# Patient Record
Sex: Male | Born: 1975
Health system: Southern US, Community
[De-identification: ages and names within clinical notes are randomized; demographics above are authoritative.]

## PROBLEM LIST (undated history)

## (undated) DIAGNOSIS — N200 Calculus of kidney: Secondary | ICD-10-CM

## (undated) DIAGNOSIS — K2980 Duodenitis without bleeding: Secondary | ICD-10-CM

## (undated) DIAGNOSIS — K449 Diaphragmatic hernia without obstruction or gangrene: Secondary | ICD-10-CM

## (undated) DIAGNOSIS — K209 Esophagitis, unspecified without bleeding: Secondary | ICD-10-CM

## (undated) DIAGNOSIS — E7800 Pure hypercholesterolemia, unspecified: Secondary | ICD-10-CM

## (undated) DIAGNOSIS — K219 Gastro-esophageal reflux disease without esophagitis: Secondary | ICD-10-CM

## (undated) HISTORY — DX: Diaphragmatic hernia without obstruction or gangrene: K44.9

## (undated) HISTORY — DX: Pure hypercholesterolemia, unspecified: E78.00

## (undated) HISTORY — DX: Calculus of kidney: N20.0

## (undated) HISTORY — DX: Esophagitis, unspecified: K20.9

## (undated) HISTORY — DX: Duodenitis without bleeding: K29.80

## (undated) HISTORY — DX: Gastro-esophageal reflux disease without esophagitis: K21.9

## (undated) HISTORY — DX: Esophagitis, unspecified without bleeding: K20.90

---

## 2008-08-29 ENCOUNTER — Emergency Department: Payer: Self-pay | Admitting: Emergency Medicine

## 2008-09-04 ENCOUNTER — Emergency Department (HOSPITAL_COMMUNITY): Admission: EM | Admit: 2008-09-04 | Discharge: 2008-09-05 | Payer: Self-pay | Admitting: Emergency Medicine

## 2008-09-07 ENCOUNTER — Ambulatory Visit: Payer: Self-pay | Admitting: Cardiology

## 2008-10-01 ENCOUNTER — Ambulatory Visit: Payer: Self-pay | Admitting: Unknown Physician Specialty

## 2008-10-30 ENCOUNTER — Emergency Department: Payer: Self-pay | Admitting: Unknown Physician Specialty

## 2008-11-07 ENCOUNTER — Ambulatory Visit: Payer: Self-pay | Admitting: Family Medicine

## 2009-06-15 HISTORY — PX: LITHOTRIPSY: SUR834

## 2009-07-08 ENCOUNTER — Ambulatory Visit: Payer: Self-pay | Admitting: Internal Medicine

## 2009-08-21 IMAGING — CT CT NECK WITH CONTRAST
1 of 2 series · 9 of 14 positions shown, 12 images · non-contrast
Comparison: none

REASON FOR EXAM: dizziness  nausea    pressure   neck pain  eval for Tumor
COMMENTS:

[Series 2: soft tissue · axial · 0.49mm/px · z∈[-12,+214]mm · 9 of 95 slices shown, 12 images]
[im 10/95  soft-tissue]
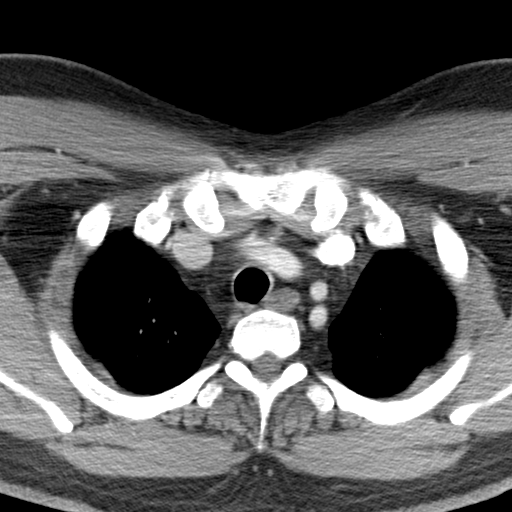
[im 10/95  bone]
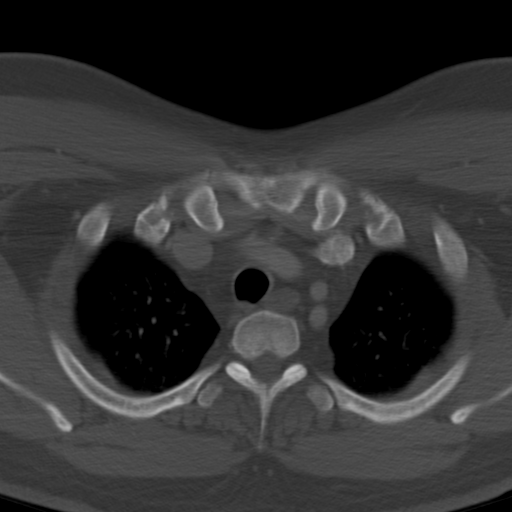
[im 19/95  bone]
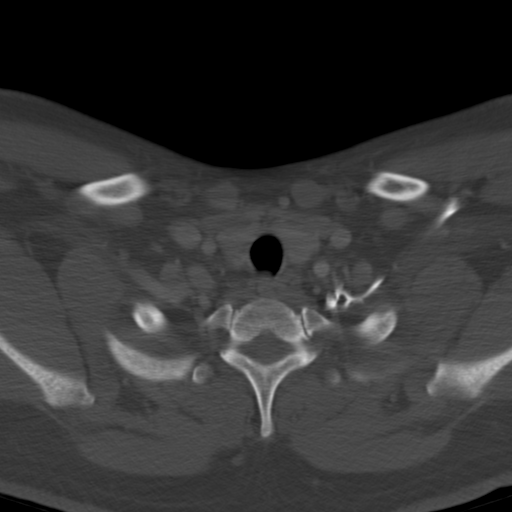
[im 29/95  bone]
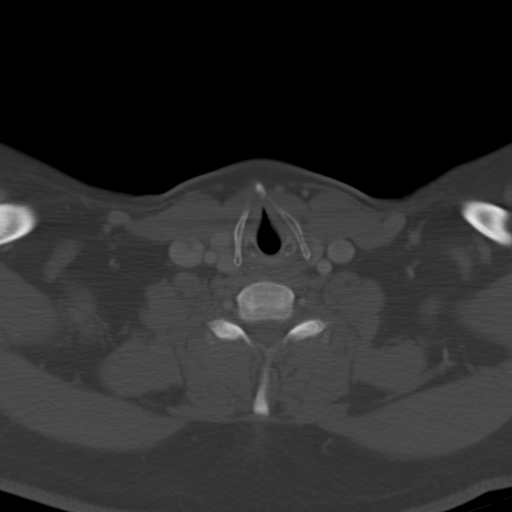
[im 38/95  bone]
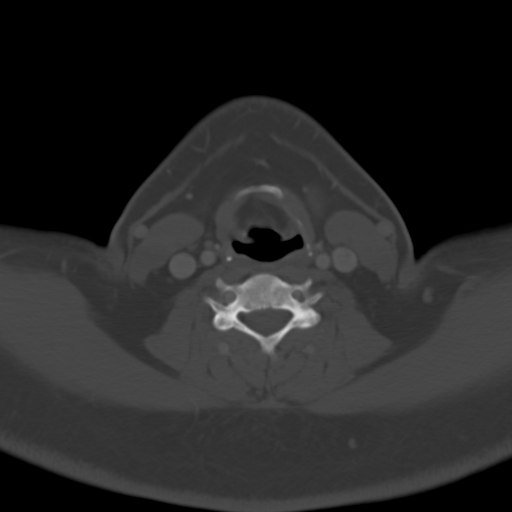
[im 48/95  soft-tissue]
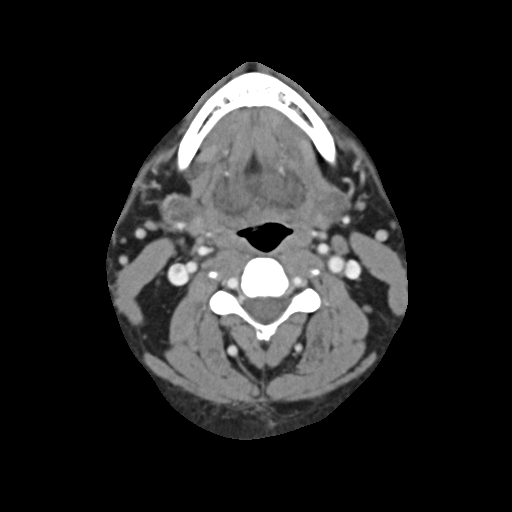
[im 48/95  bone]
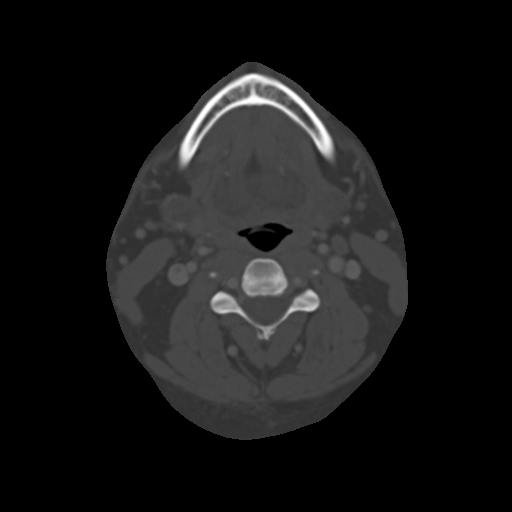
[im 57/95  bone]
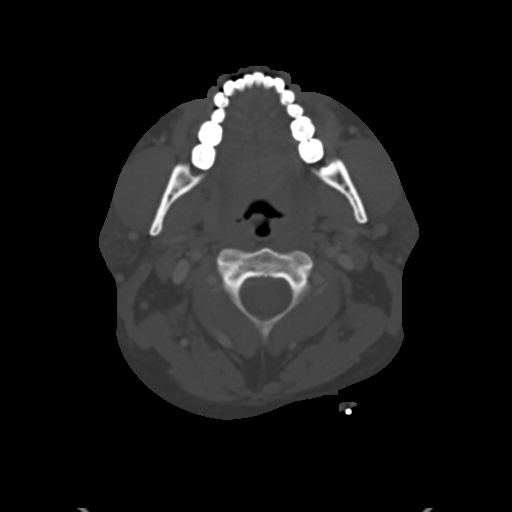
[im 66/95  bone]
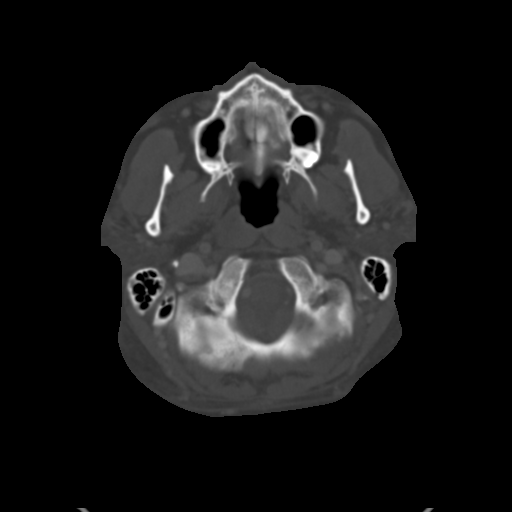
[im 76/95  bone]
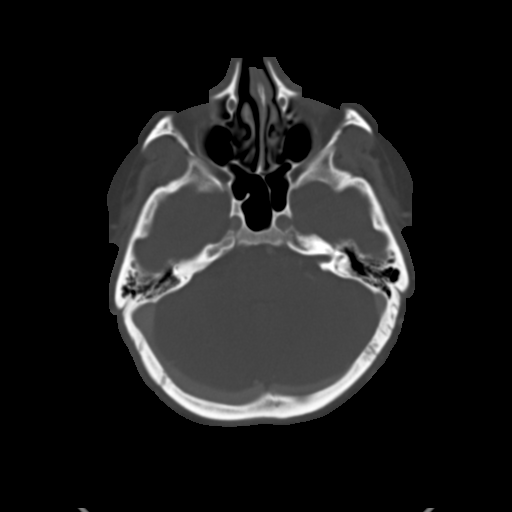
[im 85/95  soft-tissue]
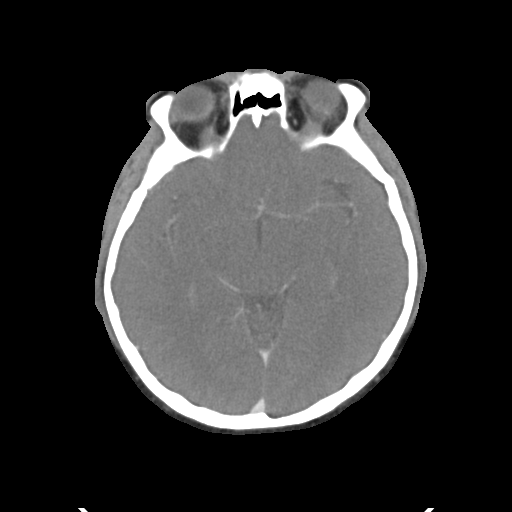
[im 85/95  bone]
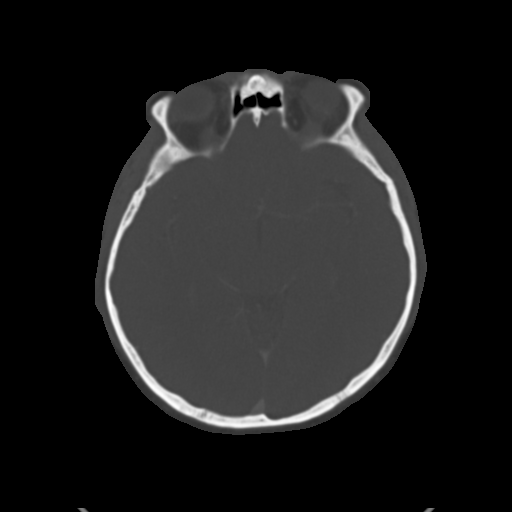

[9 of 14 positions shown; findings below may reference images not displayed]

PROCEDURE:     CT  - CT NECK WITH CONTRAST  - November 07, 2008 [DATE]

RESULT:     CT of the neck is performed utilizing a multislice helical
acquisition with 75 ml of Msovue-BIK iodinated intravenous contrast. Images
are reconstructed in the axial plane at 3 mm slice thickness.

There is no previous exam available for comparison.
FINDINGS: The superior mediastinum, thyroid lobes and salivary glands appear
to enhance normally. No mediastinal mass is evident. There is no evidence of
adenopathy by size criteria. Multiple, nonenlarged lymph nodes are seen in
the cervical region. The nasopharynx, prevertebral soft tissues, oropharynx,
tonsils, base of tongue and laryngeal regions appear to be grossly
unremarkable without a discrete mass. There is some artifact from the
patient's dental work.
IMPRESSION: No discrete mass evident. If there is concern for mass
along the mucosal surfaces, then ENT correlation would be recommended.

## 2010-01-07 ENCOUNTER — Emergency Department (HOSPITAL_COMMUNITY): Admission: EM | Admit: 2010-01-07 | Discharge: 2010-01-07 | Payer: Self-pay | Admitting: Family Medicine

## 2010-02-08 ENCOUNTER — Emergency Department: Payer: Self-pay | Admitting: Emergency Medicine

## 2010-02-13 ENCOUNTER — Ambulatory Visit: Payer: Self-pay | Admitting: Urology

## 2010-08-30 LAB — POCT URINALYSIS DIP (DEVICE)
Glucose, UA: NEGATIVE mg/dL
Ketones, ur: NEGATIVE mg/dL
Specific Gravity, Urine: 1.015 (ref 1.005–1.030)
Urobilinogen, UA: 0.2 mg/dL (ref 0.0–1.0)

## 2010-09-25 LAB — POCT CARDIAC MARKERS
CKMB, poc: <1 ng/mL — ABNORMAL LOW (ref 1.0–8.0)
Myoglobin, poc: 57.6 ng/mL (ref 12–200)
Myoglobin, poc: 59.1 ng/mL (ref 12–200)

## 2010-09-25 LAB — COMPREHENSIVE METABOLIC PANEL
ALT: 77 U/L — ABNORMAL HIGH (ref 0–53)
AST: 40 U/L — ABNORMAL HIGH (ref 0–37)
Calcium: 9.4 mg/dL (ref 8.4–10.5)
Creatinine, Ser: 0.89 mg/dL (ref 0.4–1.5)
GFR calc Af Amer: >60 mL/min (ref >60)
Sodium: 141 mEq/L (ref 135–145)
Total Protein: 6.3 g/dL (ref 6.0–8.3)

## 2010-09-25 LAB — TRICYCLICS SCREEN, URINE: TCA Scrn: NOT DETECTED

## 2010-09-25 LAB — URINALYSIS, ROUTINE W REFLEX MICROSCOPIC
Glucose, UA: NEGATIVE mg/dL
Nitrite: NEGATIVE
Protein, ur: NEGATIVE mg/dL

## 2010-09-25 LAB — CBC
MCHC: 34.7 g/dL (ref 30.0–36.0)
MCV: 88.9 fL (ref 78.0–100.0)
Platelets: 164 10*3/uL (ref 150–400)
RBC: 4.59 MIL/uL (ref 4.22–5.81)
RDW: 12.8 % (ref 11.5–15.5)

## 2010-09-25 LAB — DIFFERENTIAL
Eosinophils Absolute: 0 10*3/uL (ref 0.0–0.7)
Eosinophils Relative: 1 % (ref 0–5)
Lymphocytes Relative: 25 % (ref 12–46)
Lymphs Abs: 1.5 10*3/uL (ref 0.7–4.0)
Monocytes Relative: 8 % (ref 3–12)

## 2010-09-25 LAB — RAPID URINE DRUG SCREEN, HOSP PERFORMED
Amphetamines: NOT DETECTED
Benzodiazepines: NOT DETECTED
Cocaine: NOT DETECTED
Tetrahydrocannabinol: NOT DETECTED

## 2010-09-25 LAB — URINE CULTURE: Culture: NO GROWTH

## 2010-09-25 LAB — PROTIME-INR
INR: 0.9 (ref 0.00–1.49)
Prothrombin Time: 12.7 seconds (ref 11.6–15.2)

## 2010-10-23 ENCOUNTER — Ambulatory Visit: Payer: Self-pay | Admitting: Urology

## 2010-10-28 NOTE — Assessment & Plan Note (Signed)
[facility] OFFICE NOTE   NAME:Isaac Sims, Isaac Sims                     MRN:          [id number]  DATE:09/07/2008                            DOB:          [birth year]    PRIMARY CARE PHYSICIAN:  Dr. Dale [place], [facility].   HISTORY OF PRESENT ILLNESS:  This is a 35 year old with a history of  erosive esophagitis and gastroesophageal reflux disease who presents for  an initial visit here after having 2 recent emergency department visits  with palpitations, tachycardia, and nausea/vomiting/abdominal pain.  The  patient's first emergency department visit was a little bit over a week  ago at [facility].  He came home from work and was  lying on the sofa when he felt like his heart was beating extremely  fast.  He became lightheaded and had 2 shock-like sensations of chest  pain that lasted only for about a second each.  He went to the emergency  department.  In the emergency department, he had a normal EKG.  Rhythm  appeared normal on the monitor.  He said his palpitations gradually  resolved while he was in the emergency department.  His potassium was  noted to be somewhat low and he said he was given some potassium and was  sent home and told to stop drinking so many soft drinks.  The patient  does drink 7-8 caffeinated beverages a day.  He did not cut out caffeine  entirely.  However, he was still having spells of heart racing about 10  times a day after his discharge from the emergency department.  These  were often associated with some nausea and some pain in his left arm.  About a week later, the patient had a severe episode of nausea, pain in  his left arm and began vomiting.  He went to the emergency department at  [facility].  Once, he got to the ER, he actually started  feeling better.  EKG was normal.  Urine drug screen was normal.  He felt  like his heart was still  racing.  However, he watched his heart on the  monitor and he says it looked completely normal and regular.  He was  started back on Nexium.  It was hypothesized that he was having pain  from his erosive esophagitis and gastroesophageal reflux disease and he  was sent home.  He has had no further episodes of elevated heart rate in  the 4-5 days since then.  However, he has had spells of feeling nausea  and vomiting.  Yesterday, it was quite severe, he was nauseated and  vomited most of the morning and part of the afternoon.  This was  associated with some mild right upper quadrant discomfort.  Also, of  note, he did have an abdominal ultrasound done in the emergency  department at [facility] which showed no gallstones, but it did show  some evidence of fatty liver.  As mentioned, since he was discharged  from the [facility] Emergency Department, he has had no further  palpitations.  He has never passed out.  There is no family history of  premature coronary artery disease or any heart disease that he can think  of.  On further questioning, the patient does confirm that he has been  somewhat nauseated on and off over about the last month.  He says he  feels a little better since he started taking Nexium in the last couple  of days.   PAST MEDICAL HISTORY:  1. Erosive esophagitis with gastroesophageal reflux disease.  The      patient's most recent EGD was back in 2009.  2. Hiatal hernia.   SOCIAL HISTORY:  The patient quit smoking in February 2010.  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  He rarely drinks alcohol.  He is married.   FAMILY HISTORY:  There is no family history of premature coronary artery  disease.  There is no family history of congenital heart disease.   REVIEW OF SYSTEMS:  All the systems were reviewed and are negative  except as noted in the history of present illness.   EKG was reviewed and this shows normal sinus rhythm.  This is a normal  EKG.   LABORATORY DATA:  From the  emergency department at [facility],  hematocrit 40.8.  Chemistries were normal.  Cardiac enzymes negative.  Urine drug screen negative.   PHYSICAL EXAMINATION:  VITAL SIGNS:  Blood pressure is 98/50, heart rate  61 and regular.  GENERAL:  This is an obese male in no apparent distress.  NEUROLOGIC:  Alert and oriented x3.  Normal affect.  LUNGS:  Clear to auscultation bilaterally with normal respiratory  effort.  NECK:  There is no JVD.  There is no thyromegaly or thyroid nodule.  CARDIOVASCULAR:  Heart regular S1, S2.  No S3.  No S4.  There is no  murmur.  There is no peripheral edema.  There are 2+ posterior tibial  pulses bilaterally.  There is no carotid bruit.  ABDOMEN:  Soft and obese.  There is feeling of fullness when I palpate  in the right upper quadrant and the epigastrium.  EXTREMITIES:  No clubbing or cyanosis.  SKIN:  Normal.  MUSCULOSKELETAL:  Normal.  HEENT:  Normal.   ASSESSMENT AND PLAN:  This is a 35 year old with multiple recent  episodes of heart racing associated with nausea, vomiting, and abdominal  fullness.  My suspicion is that the root cause of the patient's symptoms  is probably gastrointestinal.  He does have a history of  gastroesophageal reflux disease and erosive esophagitis.  He says that  he was nauseated and vomited most of the day yesterday.  He did have an  abdominal ultrasound showing no gallstones.  He has started on Nexium  and says he thinks he is feeling a little bit better.  I do think that  further evaluation by gastroenterologist would be in order.  He was  seeing [facility] in [place] in the past.  However, he  wants to seen here in [place], so I will refer him over to [facility].  It is reassuring that when he felt like his  heart was racing, he could see on the monitor in the emergency  department that his heart rate looked completely normal.  All EKGs have  been normal and his telemetry  monitoring in the emergency department at  [facility] and at [facility] had been normal.  Therefore, I think we will  defer further cardiac evaluation for now.  We will send  him to see  Gastroenterology and if he has any further episodes of heart racing, we  will set him up to do a monitor.  I do not think right  now that the Holter monitor would catch anything as he has not had any  feeling of heart racing into the last 4-5 days.     Marca Ancona, MD  Electronically Signed    DM/MedQ  DD: 09/07/2008  DT: 09/08/2008  Job #: [id number]   cc:   [place]

## 2010-12-08 ENCOUNTER — Ambulatory Visit: Payer: Self-pay | Admitting: Urology

## 2011-10-09 ENCOUNTER — Ambulatory Visit: Payer: Self-pay | Admitting: Internal Medicine

## 2012-05-01 ENCOUNTER — Encounter: Payer: Self-pay | Admitting: Internal Medicine

## 2012-05-02 ENCOUNTER — Ambulatory Visit (INDEPENDENT_AMBULATORY_CARE_PROVIDER_SITE_OTHER): Payer: 59 | Admitting: Internal Medicine

## 2012-05-02 ENCOUNTER — Encounter: Payer: Self-pay | Admitting: Internal Medicine

## 2012-05-02 ENCOUNTER — Ambulatory Visit (INDEPENDENT_AMBULATORY_CARE_PROVIDER_SITE_OTHER)
Admission: RE | Admit: 2012-05-02 | Discharge: 2012-05-02 | Disposition: A | Discharge status: Home / Self Care | Payer: 59 | Source: Ambulatory Visit | Attending: Internal Medicine | Admitting: Internal Medicine

## 2012-05-02 VITALS — BP 120/70 | HR 81 | Temp 98.7°F | Ht 71.0 in | Wt 267.5 lb

## 2012-05-02 DIAGNOSIS — N200 Calculus of kidney: Secondary | ICD-10-CM

## 2012-05-02 DIAGNOSIS — M25519 Pain in unspecified shoulder: Secondary | ICD-10-CM

## 2012-05-02 DIAGNOSIS — G4730 Sleep apnea, unspecified: Secondary | ICD-10-CM

## 2012-05-02 DIAGNOSIS — R7989 Other specified abnormal findings of blood chemistry: Secondary | ICD-10-CM

## 2012-05-02 DIAGNOSIS — R945 Abnormal results of liver function studies: Secondary | ICD-10-CM

## 2012-05-02 DIAGNOSIS — E7800 Pure hypercholesterolemia, unspecified: Secondary | ICD-10-CM

## 2012-05-02 DIAGNOSIS — K219 Gastro-esophageal reflux disease without esophagitis: Secondary | ICD-10-CM

## 2012-05-02 DIAGNOSIS — Z139 Encounter for screening, unspecified: Secondary | ICD-10-CM

## 2012-05-02 LAB — LIPID PANEL
Total CHOL/HDL Ratio: 7
Triglycerides: 168 mg/dL — ABNORMAL HIGH (ref 0.0–149.0)

## 2012-05-02 LAB — HEPATIC FUNCTION PANEL
ALT: 37 U/L (ref 0–53)
AST: 27 U/L (ref 0–37)
Bilirubin, Direct: 0.1 mg/dL (ref 0.0–0.3)
Total Bilirubin: 0.5 mg/dL (ref 0.3–1.2)
Total Protein: 7.8 g/dL (ref 6.0–8.3)

## 2012-05-02 LAB — LDL CHOLESTEROL, DIRECT: Direct LDL: 183.9 mg/dL

## 2012-05-02 NOTE — Patient Instructions (Addendum)
It was nice seeing you today.  I am glad you have been doing well.  I am going to get you scheduled for a sleep study.  Let me know if you need anything.

## 2012-05-03 ENCOUNTER — Telehealth: Payer: Self-pay | Admitting: *Deleted

## 2012-05-03 ENCOUNTER — Other Ambulatory Visit: Payer: Self-pay | Admitting: Internal Medicine

## 2012-05-03 DIAGNOSIS — E7800 Pure hypercholesterolemia, unspecified: Secondary | ICD-10-CM

## 2012-05-03 DIAGNOSIS — R945 Abnormal results of liver function studies: Secondary | ICD-10-CM

## 2012-05-03 NOTE — Telephone Encounter (Signed)
Patient returned your phone call. He said you could reach him at

## 2012-05-08 ENCOUNTER — Ambulatory Visit: Payer: Self-pay | Admitting: Internal Medicine

## 2012-05-15 ENCOUNTER — Encounter: Payer: Self-pay | Admitting: Internal Medicine

## 2012-05-15 DIAGNOSIS — K219 Gastro-esophageal reflux disease without esophagitis: Secondary | ICD-10-CM | POA: Insufficient documentation

## 2012-05-15 DIAGNOSIS — N200 Calculus of kidney: Secondary | ICD-10-CM | POA: Insufficient documentation

## 2012-05-15 NOTE — Progress Notes (Signed)
  Subjective:    Patient ID: Isaac Sims, male    DOB: [birth year], 36 y.o.   MRN: [id number]  HPI 36 year old male with past history of esophagitis/duodenitis and hiatal hernia who comes in today for a scheduled follow up.  He states he is doing relatively well.  Bowels doing better.  No significant acid reflux.  Taking Prilosec regularly.  Had adjusted his diet and lost weight, but got off his routine.  Plans to get more serious with his diet and exercise.  No nausea or vomiting.  Bowels stable.  He is having some shoulder discomfort.  Has a remote history of a shoulder injury.  Does not limit him with doing his activities - just notices more at times.  Persistent/intermittent flares.    Past Medical History  Diagnosis Date  . Esophagitis   . Duodenitis   . Hiatal hernia   . Hypercholesterolemia   . Nephrolithiasis   . GERD (gastroesophageal reflux disease)     Outpatient Encounter Prescriptions as of 05/02/2012  Medication Sig Dispense Refill  . calcium carbonate (TUMS - DOSED IN MG ELEMENTAL CALCIUM) 500 MG chewable tablet Chew 1 tablet by mouth daily.      . cetirizine (ZYRTEC) 10 MG tablet Take 10 mg by mouth daily as needed.      Marland Kitchen omeprazole (PRILOSEC) 20 MG capsule Take 20 mg by mouth daily.        Review of Systems Patient denies any headache, lightheadedness or dizziness.  No significant sinus or allergy problems. No chest pain, tightness or palpitations.  No increased shortness of breath, cough or congestion.  No nausea or vomiting.  No abdominal pain or cramping.  Acid reflux controlled on Prilosec.  No significant bowel change, such as diarrhea, constipation, BRBPR or melana.  Bowels are doing better.  No urine change.  Does report increased fatigue and snoring.  Increased fatigue and daytime somnolence.  Had previously wanted to postpone a sleep study - agreeable now.      Objective:   Physical Exam Filed Vitals:   05/02/12 0944  BP: 120/70  Pulse: 81  Temp: 98.7 F  (50.73 C)   36 year old male in no acute distress.   HEENT:  Nares - clear.  OP- without lesions or erythema.  NECK:  Supple, nontender.  No audible bruit.   HEART:  Appears to be regular. LUNGS:  Without crackles or wheezing audible.  Respirations even and unlabored.   RADIAL PULSE:  Equal bilaterally.  ABDOMEN:  Soft, nontender.  No audible abdominal bruit.   EXTREMITIES:  No increased edema to be present.    MSK.  Some minimal discomfort in the right shoulder with full extension.                    Assessment & Plan:  SHOULDER PAIN.  Discussed PT.  He declines.  Will check xray.  Further w/up pending results.   WEIGHT LOSS.  Plans to get more serious about his diet and exercise.  Follow.   HIVES.  Takes Zyrtec regularly.  Doing well now.  Follow.   POSSIBLE SLEEP APNEA.  Wakes up tired, has daytime somnolence and increased snoring.  We again discussed sleep study.  He was agreeable.  Will schedule a split night sleep study.    SMOKING CESSATION.  Needs to quit smoking.    CARDIOVASCULAR.  Currently asymptomatic.  Follow.    HEALTH MAINTENANCE.  Physical 09/29/11.

## 2012-05-15 NOTE — Assessment & Plan Note (Signed)
Worked up by Dr Wolff.  S/P lithotripsy.  Currently asymptomatic.  Continue to follow up with Dr Wolff.  

## 2012-05-15 NOTE — Assessment & Plan Note (Signed)
He also has known esophagitis and duodenitis.  On prilosec now.  Symptoms controlled.  Bowels better.     

## 2012-06-02 ENCOUNTER — Telehealth: Payer: Self-pay | Admitting: *Deleted

## 2012-06-03 NOTE — Telephone Encounter (Signed)
error 

## 2012-07-30 ENCOUNTER — Other Ambulatory Visit: Payer: Self-pay

## 2012-08-03 ENCOUNTER — Telehealth: Payer: Self-pay | Admitting: *Deleted

## 2012-08-03 ENCOUNTER — Encounter: Payer: Self-pay | Admitting: Internal Medicine

## 2012-08-03 ENCOUNTER — Encounter: Payer: Self-pay | Admitting: *Deleted

## 2012-08-03 NOTE — Progress Notes (Signed)
Letter sent to patient regarding his sleep apnea and contacting his insurance company for approval.

## 2012-08-03 NOTE — Telephone Encounter (Signed)
Patient left message stating they are waiting on a date for him to do the second part of his sleep study. Please call patient to clarify.

## 2012-08-03 NOTE — Progress Notes (Signed)
  Subjective:    Patient ID: Isaac Sims, male    DOB: 08-10-1975, 37 y.o.   MRN: [id number]  HPI    Review of Systems     Objective:   Physical Exam        Assessment & Plan:

## 2012-08-04 NOTE — Telephone Encounter (Signed)
Isaac Sims had his form and was asking me about it yesterday.  Please get form and contact [facility] regarding appt time.

## 2012-08-09 NOTE — Telephone Encounter (Signed)
Pt returned jamie's call.  Spouse stated they are ready to set up appointment

## 2012-08-10 NOTE — Telephone Encounter (Signed)
Documents given to [facility] and she will call patient to have him reschedule his appointment with [facility]. No referral is necessary at this time per Amber.

## 2012-09-22 ENCOUNTER — Encounter: Payer: Self-pay | Admitting: Internal Medicine

## 2012-09-22 ENCOUNTER — Ambulatory Visit: Payer: 59 | Admitting: Adult Health

## 2012-09-22 ENCOUNTER — Ambulatory Visit (INDEPENDENT_AMBULATORY_CARE_PROVIDER_SITE_OTHER): Payer: BC Managed Care – PPO | Admitting: Internal Medicine

## 2012-09-22 ENCOUNTER — Other Ambulatory Visit: Payer: Self-pay | Admitting: Rheumatology

## 2012-09-22 VITALS — BP 120/72 | HR 84 | Temp 97.6°F | Resp 18 | Wt 281.8 lb

## 2012-09-22 DIAGNOSIS — M25429 Effusion, unspecified elbow: Secondary | ICD-10-CM

## 2012-09-22 DIAGNOSIS — M25421 Effusion, right elbow: Secondary | ICD-10-CM

## 2012-09-22 LAB — SYNOVIAL CELL COUNT + DIFF, W/ CRYSTALS
Basophil: 0 %
Crystals, Joint Fluid: NONE SEEN
Eosinophil: 0 %
Nucleated Cell Count: 1291 /mm3
Other Cells BF: 0 %
Other Mononuclear Cells: 29 %

## 2012-09-25 ENCOUNTER — Encounter: Payer: Self-pay | Admitting: Internal Medicine

## 2012-09-25 NOTE — Progress Notes (Signed)
Subjective:    Patient ID: Isaac Sims, male    DOB: 08/26/1975, 37 y.o.   MRN: [id number]  HPI 37 year old male with past history of esophagitis/duodenitis and hiatal hernia who comes in today as a work in with concerns regarding increased swelling around his elbow.  He states he has been doing relatively well.  Noticed a couple of weeks ago swelling around his right elbow.  He had been working under his house.  Crawling and applying increased pressure on his elbows for several days.  Then noticed some increased soft tissue swelling, redness and increased warmth - right elbow.  The swelling has improved.  No redness or warmth now.  No pain.  Able to bend his arm without difficulty.  He was concerned because of the persistent swelling.    I also discussed with him regarding his recent sleep study.  Having a hard time getting the CPAP titration.  Insurance not covering.  Have given him some options to look into.  He does plan to follow through with this.    Past Medical History  Diagnosis Date  . Esophagitis   . Duodenitis   . Hiatal hernia   . Hypercholesterolemia   . Nephrolithiasis   . GERD (gastroesophageal reflux disease)     Outpatient Encounter Prescriptions as of 09/22/2012  Medication Sig Dispense Refill  . calcium carbonate (TUMS - DOSED IN MG ELEMENTAL CALCIUM) 500 MG chewable tablet Chew 1 tablet by mouth daily.      . cetirizine (ZYRTEC) 10 MG tablet Take 10 mg by mouth daily as needed.      Marland Kitchen omeprazole (PRILOSEC) 20 MG capsule Take 20 mg by mouth daily.       No facility-administered encounter medications on file as of 09/22/2012.    Review of Systems Patient denies any headache, lightheadedness or dizziness.  No significant sinus or allergy problems. No chest pain, tightness or palpitations.  No increased shortness of breath, cough or congestion.  No nausea or vomiting.  No abdominal pain or cramping.  Main complaint is that of persistent right elbow swelling.  No pain  now.  No redness or increased warmth now.       Objective:   Physical Exam  Filed Vitals:   09/22/12 1401  BP: 120/72  Pulse: 84  Temp: 97.6 F (36.4 C)  Resp: 45   37 year old male in no acute distress.  NECK:  Supple, nontender.  No audible bruit.   HEART:  Appears to be regular. LUNGS:  Without crackles or wheezing audible.  Respirations even and unlabored.   RADIAL PULSE:  Equal bilaterally.  MSK:  Increased soft tissue swelling - right elbow.  No pain with flexion and extension.  No increased erythema or warmth.                    Assessment & Plan:  MSK.  Elbow swelling and exam as outlined.  Has improved.  Discussed conservative measures - especially given improvement.  He states the swelling has not improved further in the past week and request referral for further evaluation and possible aspiration.  Will refer to Dr Gavin Potters for further evaluation and treatment.   SLEEP APNEA.  Recent sleep study revealed sleep apnea.  Needs CPAP titration.  Insurance not covering.  Have given him other options to check out.  He plans to follow through with getting this arranged.    CARDIOVASCULAR.  Currently asymptomatic.  Follow.  HEALTH MAINTENANCE.  Physical 09/29/11.

## 2012-10-03 ENCOUNTER — Other Ambulatory Visit: Payer: 59

## 2012-10-05 ENCOUNTER — Encounter: Payer: 59 | Admitting: Internal Medicine

## 2012-10-10 ENCOUNTER — Other Ambulatory Visit (INDEPENDENT_AMBULATORY_CARE_PROVIDER_SITE_OTHER): Payer: BC Managed Care – PPO

## 2012-10-10 DIAGNOSIS — R945 Abnormal results of liver function studies: Secondary | ICD-10-CM

## 2012-10-10 DIAGNOSIS — R7989 Other specified abnormal findings of blood chemistry: Secondary | ICD-10-CM

## 2012-10-10 DIAGNOSIS — E7800 Pure hypercholesterolemia, unspecified: Secondary | ICD-10-CM

## 2012-10-10 LAB — LIPID PANEL
HDL: 29.6 mg/dL — ABNORMAL LOW (ref >39.00)
Total CHOL/HDL Ratio: 8
VLDL: 60.6 mg/dL — ABNORMAL HIGH (ref 0.0–40.0)

## 2012-10-10 LAB — HEPATIC FUNCTION PANEL
AST: 31 U/L (ref 0–37)
Alkaline Phosphatase: 46 U/L (ref 39–117)
Bilirubin, Direct: 0 mg/dL (ref 0.0–0.3)

## 2012-10-10 LAB — LDL CHOLESTEROL, DIRECT: Direct LDL: 192.4 mg/dL

## 2012-10-13 ENCOUNTER — Ambulatory Visit (INDEPENDENT_AMBULATORY_CARE_PROVIDER_SITE_OTHER): Payer: BC Managed Care – PPO | Admitting: Internal Medicine

## 2012-10-13 ENCOUNTER — Encounter: Payer: Self-pay | Admitting: Internal Medicine

## 2012-10-13 VITALS — BP 122/80 | HR 77 | Temp 97.8°F | Ht 71.5 in | Wt 285.8 lb

## 2012-10-13 DIAGNOSIS — G4733 Obstructive sleep apnea (adult) (pediatric): Secondary | ICD-10-CM

## 2012-10-13 DIAGNOSIS — K219 Gastro-esophageal reflux disease without esophagitis: Secondary | ICD-10-CM

## 2012-10-13 DIAGNOSIS — N200 Calculus of kidney: Secondary | ICD-10-CM

## 2012-10-13 DIAGNOSIS — E7800 Pure hypercholesterolemia, unspecified: Secondary | ICD-10-CM

## 2012-10-13 NOTE — Progress Notes (Signed)
  Subjective:    Patient ID: Isaac Sims, male    DOB: 1975-08-21, 37 y.o.   MRN: [id number]  HPI 37 year old male with past history of esophagitis/duodenitis and hiatal hernia who comes in today to follow up on these issues as well as for a complete physical exam.  He states he has been doing relatively well.  Had elbow swelling previously.  Saw Dr Gavin Potters and had fluid aspirated.  Doing well now.  No pain.  Has been using CPAP autotitration for the last two weeks.  Sleeping better.  Energy better.  Still getting used to the mask.     Past Medical History  Diagnosis Date  . Esophagitis   . Duodenitis   . Hiatal hernia   . Hypercholesterolemia   . Nephrolithiasis   . GERD (gastroesophageal reflux disease)     Outpatient Encounter Prescriptions as of 10/13/2012  Medication Sig Dispense Refill  . calcium carbonate (TUMS - DOSED IN MG ELEMENTAL CALCIUM) 500 MG chewable tablet Chew 1 tablet by mouth daily.      . cetirizine (ZYRTEC) 10 MG tablet Take 10 mg by mouth daily as needed.      . [DISCONTINUED] omeprazole (PRILOSEC) 20 MG capsule Take 20 mg by mouth daily.       No facility-administered encounter medications on file as of 10/13/2012.    Review of Systems Patient denies any headache, lightheadedness or dizziness.  No significant sinus or allergy problems. No chest pain, tightness or palpitations.  No increased shortness of breath, cough or congestion.  No nausea or vomiting.  No acid reflux.  No abdominal pain or cramping.  No elbow swelling now.  S/P aspiration.  Bowels stable.  Appear to be doing overall better.  Much better if watches what he eats. Overall he feels he is doing well.       Objective:   Physical Exam  Filed Vitals:   10/13/12 1322  BP: 122/80  Pulse: 77  Temp: 97.8 F (28.34 C)   37 year old male in no acute distress.  HEENT:  Nares - clear.  Oropharynx - without lesions. NECK:  Supple.  Nontender.  No audible carotid bruit.  HEART:  Appears to be  regular.   LUNGS:  No crackles or wheezing audible.  Respirations even and unlabored.   RADIAL PULSE:  Equal bilaterally.  ABDOMEN:  Soft.  Nontender.  Bowel sounds present and normal.  No audible abdominal bruit.  GU:  He declined.   EXTREMITIES:  No increased edema present.  DP pulses palpable and equal bilaterally.          Assessment & Plan:  MSK.  Elbow doing better.  No swelling.  S/p aspiration.  Follow.   SLEEP APNEA.  Recent sleep study revealed sleep apnea. Doing autotitration.  Sleeping better and feels better.  Follow.     CARDIOVASCULAR.  Currently asymptomatic.  Follow.    HEALTH MAINTENANCE.  Physical today.

## 2012-10-14 ENCOUNTER — Telehealth: Payer: Self-pay | Admitting: Internal Medicine

## 2012-10-14 NOTE — Telephone Encounter (Signed)
Tried to reach office on 5/2-office closed at 3:00.

## 2012-10-14 NOTE — Telephone Encounter (Signed)
Wilkie Aye from [employer] asking for call back at [phone] for verbal okay to give pt CPAP.

## 2012-10-14 NOTE — Telephone Encounter (Signed)
Pt has completed 2 week autotitration trial with CPAP.  Pt has decided to continue with the CPAP permanently.  Needing new order for permanent CPAP.

## 2012-10-16 ENCOUNTER — Encounter: Payer: Self-pay | Admitting: Internal Medicine

## 2012-10-16 DIAGNOSIS — G4733 Obstructive sleep apnea (adult) (pediatric): Secondary | ICD-10-CM | POA: Insufficient documentation

## 2012-10-16 DIAGNOSIS — E7800 Pure hypercholesterolemia, unspecified: Secondary | ICD-10-CM | POA: Insufficient documentation

## 2012-10-16 NOTE — Assessment & Plan Note (Signed)
He also has known esophagitis and duodenitis.  On prilosec now.  Symptoms controlled.  Bowels better.

## 2012-10-16 NOTE — Assessment & Plan Note (Signed)
Worked up by Dr Evelene Croon.  S/P lithotripsy.  Currently asymptomatic.  Continue to follow up with Dr Evelene Croon.

## 2012-10-16 NOTE — Assessment & Plan Note (Signed)
Currently doing autotitration now.  Sleeping better.  Feels better.  Follow.

## 2012-10-16 NOTE — Assessment & Plan Note (Signed)
Recent lipid profile revealed total cholesterol 243, triglycerides 303, HDL 30 and LDL 192.  [place] diet, exercise.  Follow.

## 2012-10-18 ENCOUNTER — Other Ambulatory Visit: Payer: Self-pay | Admitting: Rheumatology

## 2012-10-18 LAB — SYNOVIAL CELL COUNT + DIFF, W/ CRYSTALS
Crystals, Joint Fluid: NONE SEEN
Lymphocytes: 36 %
Neutrophils: 22 %
Nucleated Cell Count: 2092 /mm3
Other Cells BF: 0 %
Other Mononuclear Cells: 42 %

## 2012-10-22 LAB — AEROBIC CULTURE

## 2013-04-13 ENCOUNTER — Other Ambulatory Visit (INDEPENDENT_AMBULATORY_CARE_PROVIDER_SITE_OTHER): Payer: BC Managed Care – PPO

## 2013-04-13 DIAGNOSIS — E7800 Pure hypercholesterolemia, unspecified: Secondary | ICD-10-CM

## 2013-04-13 LAB — LIPID PANEL
HDL: 30.8 mg/dL — ABNORMAL LOW (ref >39.00)
Total CHOL/HDL Ratio: 7
Triglycerides: 238 mg/dL — ABNORMAL HIGH (ref 0.0–149.0)
VLDL: 47.6 mg/dL — ABNORMAL HIGH (ref 0.0–40.0)

## 2013-04-13 LAB — LDL CHOLESTEROL, DIRECT: Direct LDL: 147.2 mg/dL

## 2013-04-14 ENCOUNTER — Encounter: Payer: Self-pay | Admitting: Internal Medicine

## 2013-04-17 ENCOUNTER — Ambulatory Visit (INDEPENDENT_AMBULATORY_CARE_PROVIDER_SITE_OTHER): Payer: BC Managed Care – PPO | Admitting: Internal Medicine

## 2013-04-17 ENCOUNTER — Encounter: Payer: Self-pay | Admitting: Internal Medicine

## 2013-04-17 VITALS — BP 110/60 | HR 77 | Temp 97.8°F | Ht 71.5 in | Wt 266.5 lb

## 2013-04-17 DIAGNOSIS — E7800 Pure hypercholesterolemia, unspecified: Secondary | ICD-10-CM

## 2013-04-17 DIAGNOSIS — G4733 Obstructive sleep apnea (adult) (pediatric): Secondary | ICD-10-CM

## 2013-04-17 DIAGNOSIS — N200 Calculus of kidney: Secondary | ICD-10-CM

## 2013-04-17 DIAGNOSIS — K219 Gastro-esophageal reflux disease without esophagitis: Secondary | ICD-10-CM

## 2013-04-17 DIAGNOSIS — Z125 Encounter for screening for malignant neoplasm of prostate: Secondary | ICD-10-CM

## 2013-04-17 NOTE — Assessment & Plan Note (Addendum)
Utilizing CPAP.  Discussing with the company regarding some issues with his machine.   Feels better.  Follow.  Stressed importance of using.

## 2013-04-17 NOTE — Progress Notes (Signed)
Subjective:    Patient ID: Isaac Sims, male    DOB: 08-09-1975, 37 y.o.   MRN: [id number]  HPI 37 year old male with past history of esophagitis/duodenitis and hiatal hernia who comes in today for a scheduled follow up.  He states he has been doing relatively well.  Had elbow swelling previously.  Saw Dr Gavin Potters and had fluid aspirated.  Doing well now.  No pain.  No swelling.  Has been having some issues with his CPAP.  Still uses.  Trouble with the mask - at times.  He has discussed this with the company.  Plans to get this fixed.  Feels better when he uses.  Has adjusted his diet.  Lost weight.  He reports previously doing better with weight loss, but has gotten out of the routine lately.  Plans to restart.  Overall does feel better.  Cholesterol has improved.  He does report some problems with both ankles -(Right > left).  States when he walks - feels as if his ankle may give way.  This has been an issue for years.  Previously went to therapy.  Notices more playing softball.  No pain.     Past Medical History  Diagnosis Date  . Esophagitis   . Duodenitis   . Hiatal hernia   . Hypercholesterolemia   . Nephrolithiasis   . GERD (gastroesophageal reflux disease)     Outpatient Encounter Prescriptions as of 04/17/2013  Medication Sig  . calcium carbonate (TUMS - DOSED IN MG ELEMENTAL CALCIUM) 500 MG chewable tablet Chew 1 tablet by mouth daily.  . cetirizine (ZYRTEC) 10 MG tablet Take 10 mg by mouth daily as needed.  Marland Kitchen omeprazole (PRILOSEC) 20 MG capsule Take 20 mg by mouth daily.    Review of Systems Patient denies any headache, lightheadedness or dizziness.  No significant sinus or allergy problems. No chest pain, tightness or palpitations.  No increased shortness of breath, cough or congestion.  No nausea or vomiting.  No acid reflux.  No abdominal pain or cramping.  No elbow swelling now.  S/P aspiration.  Bowels stable.  Appear to be doing overall better.  Much better if  watches what he eats. Overall he feels he is doing well.   Ankle issues as outlined.       Objective:   Physical Exam  Filed Vitals:   04/17/13 1001  BP: 110/60  Pulse: 77  Temp: 97.8 F (56.31 C)   37 year old male in no acute distress.  HEENT:  Nares - clear.  Oropharynx - without lesions. NECK:  Supple.  Nontender.  No audible carotid bruit.  HEART:  Appears to be regular.   LUNGS:  No crackles or wheezing audible.  Respirations even and unlabored.   RADIAL PULSE:  Equal bilaterally.  ABDOMEN:  Soft.  Nontender.  Bowel sounds present and normal.  No audible abdominal bruit.   EXTREMITIES:  No increased edema present.  DP pulses palpable and equal bilaterally.  MSK:  No pain to palpation over the ankles.  Good rom.  No pain with rom.           Assessment & Plan:  MSK.  Elbow doing better.  No swelling.  S/p aspiration.  Follow.  Now reports the ankle "weakness" as outlined.  Discussed with him regarding further evaluation and w/up.   He declines.  Will restart the exercise from therapy.  Ankle support.  Follow.  Will notify me if he desires any further w/up  or evaluation.    CARDIOVASCULAR.  Currently asymptomatic.  Follow.    HEALTH MAINTENANCE.  Schedule a physical for next visit.

## 2013-04-18 ENCOUNTER — Encounter: Payer: Self-pay | Admitting: Internal Medicine

## 2013-04-18 NOTE — Assessment & Plan Note (Signed)
Recent lipid profile revealed total cholesterol 211, triglycerides 238, HDL 31 and LDL 147.  [place] diet, exercise.  Follow.  Continue weight loss.

## 2013-04-18 NOTE — Assessment & Plan Note (Signed)
He also has known esophagitis and duodenitis.  On prilosec now.  Symptoms controlled.  Bowels better.

## 2013-04-18 NOTE — Assessment & Plan Note (Signed)
Worked up by Dr Evelene Croon.  S/P lithotripsy.  Currently asymptomatic.  Continue to follow up with Dr Evelene Croon.

## 2013-10-10 ENCOUNTER — Other Ambulatory Visit: Payer: BC Managed Care – PPO

## 2013-10-17 ENCOUNTER — Encounter: Payer: BC Managed Care – PPO | Admitting: Internal Medicine

## 2014-04-11 ENCOUNTER — Other Ambulatory Visit (INDEPENDENT_AMBULATORY_CARE_PROVIDER_SITE_OTHER): Payer: BC Managed Care – PPO

## 2014-04-11 ENCOUNTER — Encounter: Payer: Self-pay | Admitting: Internal Medicine

## 2014-04-11 DIAGNOSIS — E7800 Pure hypercholesterolemia, unspecified: Secondary | ICD-10-CM

## 2014-04-11 DIAGNOSIS — Z125 Encounter for screening for malignant neoplasm of prostate: Secondary | ICD-10-CM

## 2014-04-11 DIAGNOSIS — G4733 Obstructive sleep apnea (adult) (pediatric): Secondary | ICD-10-CM

## 2014-04-11 LAB — CBC WITH DIFFERENTIAL/PLATELET
Basophils Absolute: 0 10*3/uL (ref 0.0–0.1)
Basophils Relative: 0.4 % (ref 0.0–3.0)
EOS ABS: 0.1 10*3/uL (ref 0.0–0.7)
Eosinophils Relative: 0.9 % (ref 0.0–5.0)
HCT: 44.9 % (ref 39.0–52.0)
Hemoglobin: 15.2 g/dL (ref 13.0–17.0)
LYMPHS PCT: 32.6 % (ref 12.0–46.0)
Lymphs Abs: 2.6 10*3/uL (ref 0.7–4.0)
MCHC: 33.7 g/dL (ref 30.0–36.0)
MCV: 88.8 fl (ref 78.0–100.0)
Monocytes Absolute: 0.6 10*3/uL (ref 0.1–1.0)
Monocytes Relative: 7.2 % (ref 3.0–12.0)
NEUTROS PCT: 58.9 % (ref 43.0–77.0)
Neutro Abs: 4.7 10*3/uL (ref 1.4–7.7)
PLATELETS: 238 10*3/uL (ref 150.0–400.0)
RBC: 5.06 Mil/uL (ref 4.22–5.81)
RDW: 13 % (ref 11.5–15.5)
WBC: 8 10*3/uL (ref 4.0–10.5)

## 2014-04-11 LAB — COMPREHENSIVE METABOLIC PANEL
ALBUMIN: 3.4 g/dL — AB (ref 3.5–5.2)
ALK PHOS: 56 U/L (ref 39–117)
ALT: 34 U/L (ref 0–53)
AST: 24 U/L (ref 0–37)
BUN: 17 mg/dL (ref 6–23)
CO2: 18 mEq/L — ABNORMAL LOW (ref 19–32)
Calcium: 9.8 mg/dL (ref 8.4–10.5)
Chloride: 106 mEq/L (ref 96–112)
Creatinine, Ser: 1 mg/dL (ref 0.4–1.5)
GFR: 86.73 mL/min (ref >60.00)
Glucose, Bld: 111 mg/dL — ABNORMAL HIGH (ref 70–99)
POTASSIUM: 4.4 meq/L (ref 3.5–5.1)
Sodium: 139 mEq/L (ref 135–145)
Total Bilirubin: 0.5 mg/dL (ref 0.2–1.2)
Total Protein: 7.3 g/dL (ref 6.0–8.3)

## 2014-04-11 LAB — LIPID PANEL
CHOLESTEROL: 222 mg/dL — AB (ref 0–200)
HDL: 30.8 mg/dL — ABNORMAL LOW (ref >39.00)
NonHDL: 191.2
Total CHOL/HDL Ratio: 7
Triglycerides: 236 mg/dL — ABNORMAL HIGH (ref 0.0–149.0)
VLDL: 47.2 mg/dL — AB (ref 0.0–40.0)

## 2014-04-11 LAB — PSA: PSA: 0.9 ng/mL (ref 0.10–4.00)

## 2014-04-11 LAB — TSH: TSH: 1.81 u[IU]/mL (ref 0.35–4.50)

## 2014-04-11 LAB — LDL CHOLESTEROL, DIRECT: Direct LDL: 149.8 mg/dL

## 2014-04-16 NOTE — Telephone Encounter (Signed)
Unread mychart message mailed to patient 

## 2014-04-17 ENCOUNTER — Ambulatory Visit (INDEPENDENT_AMBULATORY_CARE_PROVIDER_SITE_OTHER): Payer: BC Managed Care – PPO | Admitting: Internal Medicine

## 2014-04-17 ENCOUNTER — Encounter: Payer: Self-pay | Admitting: Internal Medicine

## 2014-04-17 VITALS — BP 110/60 | HR 89 | Temp 97.7°F | Ht 71.25 in | Wt 276.9 lb

## 2014-04-17 DIAGNOSIS — N200 Calculus of kidney: Secondary | ICD-10-CM

## 2014-04-17 DIAGNOSIS — G4733 Obstructive sleep apnea (adult) (pediatric): Secondary | ICD-10-CM

## 2014-04-17 DIAGNOSIS — E7800 Pure hypercholesterolemia, unspecified: Secondary | ICD-10-CM

## 2014-04-17 DIAGNOSIS — R739 Hyperglycemia, unspecified: Secondary | ICD-10-CM

## 2014-04-17 DIAGNOSIS — Z720 Tobacco use: Secondary | ICD-10-CM

## 2014-04-17 DIAGNOSIS — R05 Cough: Secondary | ICD-10-CM

## 2014-04-17 DIAGNOSIS — K219 Gastro-esophageal reflux disease without esophagitis: Secondary | ICD-10-CM

## 2014-04-17 DIAGNOSIS — R059 Cough, unspecified: Secondary | ICD-10-CM

## 2014-04-17 NOTE — Patient Instructions (Signed)
Stay hydrated.  Robitussin as directed.

## 2014-04-17 NOTE — Progress Notes (Signed)
Subjective:    Patient ID: Isaac Sims, male    DOB: 1975-10-25, 38 y.o.   MRN: [id number]  HPI 38 year old male with past history of esophagitis/duodenitis and hiatal hernia who comes in today to follow up on these issues as well as for a complete physical exam.   He states he has been doing relatively well.  Had elbow swelling previously.  Saw Dr [facility] and had fluid aspirated.  Doing well now.  No pain.  No swelling.  Has been having some issues with his CPAP.  Still uses.  Trouble with the mask - at times.  He has discussed this with the company.  Plans to get this fixed.  Feels better when he uses.  He reports previously doing better with weight loss, but has gotten out of the routine lately.  Plans to restart.  Stopped smoking a few days ago.  Also has cut out the soft drinks.  Last week had a cold and cough.  Better today.     Past Medical History  Diagnosis Date  . Esophagitis   . Duodenitis   . Hiatal hernia   . Hypercholesterolemia   . Nephrolithiasis   . GERD (gastroesophageal reflux disease)     Outpatient Encounter Prescriptions as of 04/17/2014  Medication Sig  . calcium carbonate (TUMS - DOSED IN MG ELEMENTAL CALCIUM) 500 MG chewable tablet Chew 1 tablet by mouth daily as needed.   . cetirizine (ZYRTEC) 10 MG tablet Take 10 mg by mouth daily as needed.  Marland Kitchen. omeprazole (PRILOSEC) 20 MG capsule Take 20 mg by mouth daily as needed.     Review of Systems Patient denies any headache, lightheadedness or dizziness.  No significant sinus or allergy problems. No chest pain, tightness or palpitations.  No increased shortness of breath.  Some residual cough.  Better.   No nausea or vomiting.  No acid reflux.  No abdominal pain or cramping.  No elbow swelling now.  S/P aspiration.  Bowels stable.  Appear to be doing overall better.  Much better if watches what he eats. Overall he feels he is doing well.   Plans to get more serious about his diet and exercise.        Objective:    Physical Exam  Filed Vitals:   04/17/14 0843  BP: 110/60  Pulse: 89  Temp: 97.7 F (7036.305 C)   38 year old male in no acute distress.  HEENT:  Nares - clear.  Oropharynx - without lesions. NECK:  Supple.  Nontender.  No audible carotid bruit.  HEART:  Appears to be regular.   LUNGS:  No crackles or wheezing audible.  Respirations even and unlabored.   RADIAL PULSE:  Equal bilaterally.  ABDOMEN:  Soft.  Nontender.  Bowel sounds present and normal.  No audible abdominal bruit.  GU:  He declined.  Perfers to f/u with Dr Evelene CroonWolff.    EXTREMITIES:  No increased edema present.  DP pulses palpable and equal bilaterally.         Assessment & Plan:  MSK.  Elbow doing better.  No swelling.  S/p aspiration.  Follow.    CARDIOVASCULAR.  Currently asymptomatic.  Follow.    Obstructive sleep apnea Using CPAP.  Pulls off mask through the night.  Plans to discuss with supply company regarding a different mask.    Gastroesophageal reflux disease, esophagitis presence not specified On omeprazole.  Improved.    Nephrolithiasis Worked up by Dr Evelene CroonWolff.  S/p lithotripsy.  Currently doing well.    Hypercholesterolemia Cholesterol elevated on recent check.  Triglycerides 236 and LDL 150.  Discussed with him today.  He declines cholesterol medication.  Wants to work on diet and exercise.  Follow.   Lab Results  Component Value Date   CHOL 222* 04/11/2014   HDL 30.80* 04/11/2014   LDLDIRECT 149.8 04/11/2014   TRIG 236.0* 04/11/2014   CHOLHDL 7 04/11/2014   Tobacco use Has quit smoking.  Does dip.  Follow.    Cough Worse last week.  Better now.  Robitussin prn.  Hold abx.  Follow.   HYPERGLYCEMIA.  Sugar slightly elevated on recent check.  Check fasting glucose and a1c.     HEALTH MAINTENANCE.  Physical today.  PSA .90 (04/11/14).  He desires to f/u with Dr Evelene CroonWolff regarding prostate check.

## 2014-04-22 ENCOUNTER — Encounter: Payer: Self-pay | Admitting: Internal Medicine

## 2014-05-08 ENCOUNTER — Other Ambulatory Visit (INDEPENDENT_AMBULATORY_CARE_PROVIDER_SITE_OTHER): Payer: BC Managed Care – PPO

## 2014-05-08 ENCOUNTER — Encounter: Payer: Self-pay | Admitting: Internal Medicine

## 2014-05-08 DIAGNOSIS — R739 Hyperglycemia, unspecified: Secondary | ICD-10-CM

## 2014-05-08 LAB — HEMOGLOBIN A1C: Hgb A1c MFr Bld: 6 % (ref 4.6–6.5)

## 2014-05-09 LAB — GLUCOSE, FASTING: Glucose, Fasting: 91 mg/dL (ref 70–99)

## 2014-10-11 ENCOUNTER — Other Ambulatory Visit: Payer: BC Managed Care – PPO

## 2014-10-16 ENCOUNTER — Ambulatory Visit: Payer: BC Managed Care – PPO | Admitting: Internal Medicine

## 2015-02-06 ENCOUNTER — Telehealth: Payer: Self-pay | Admitting: *Deleted

## 2015-02-06 DIAGNOSIS — E7800 Pure hypercholesterolemia, unspecified: Secondary | ICD-10-CM

## 2015-02-06 DIAGNOSIS — R739 Hyperglycemia, unspecified: Secondary | ICD-10-CM

## 2015-02-06 NOTE — Telephone Encounter (Signed)
Order placed for labs.

## 2015-02-06 NOTE — Telephone Encounter (Signed)
Labs and dx?  

## 2015-02-08 ENCOUNTER — Other Ambulatory Visit (INDEPENDENT_AMBULATORY_CARE_PROVIDER_SITE_OTHER): Payer: BLUE CROSS/BLUE SHIELD

## 2015-02-08 DIAGNOSIS — E7800 Pure hypercholesterolemia, unspecified: Secondary | ICD-10-CM

## 2015-02-08 DIAGNOSIS — R739 Hyperglycemia, unspecified: Secondary | ICD-10-CM | POA: Diagnosis not present

## 2015-02-08 LAB — COMPREHENSIVE METABOLIC PANEL
ALK PHOS: 52 U/L (ref 39–117)
ALT: 61 U/L — ABNORMAL HIGH (ref 0–53)
AST: 36 U/L (ref 0–37)
Albumin: 4.3 g/dL (ref 3.5–5.2)
BILIRUBIN TOTAL: 0.5 mg/dL (ref 0.2–1.2)
BUN: 18 mg/dL (ref 6–23)
CO2: 27 meq/L (ref 19–32)
CREATININE: 0.95 mg/dL (ref 0.40–1.50)
Calcium: 9.4 mg/dL (ref 8.4–10.5)
Chloride: 104 mEq/L (ref 96–112)
GFR: 92.7 mL/min (ref >60.00)
Glucose, Bld: 129 mg/dL — ABNORMAL HIGH (ref 70–99)
Potassium: 4 mEq/L (ref 3.5–5.1)
SODIUM: 141 meq/L (ref 135–145)
Total Protein: 7.2 g/dL (ref 6.0–8.3)

## 2015-02-08 LAB — LDL CHOLESTEROL, DIRECT: Direct LDL: 149 mg/dL

## 2015-02-08 LAB — LIPID PANEL
Cholesterol: 234 mg/dL — ABNORMAL HIGH (ref 0–200)
HDL: 32.9 mg/dL — AB (ref >39.00)
NONHDL: 201.18
Total CHOL/HDL Ratio: 7
Triglycerides: 311 mg/dL — ABNORMAL HIGH (ref 0.0–149.0)
VLDL: 62.2 mg/dL — ABNORMAL HIGH (ref 0.0–40.0)

## 2015-02-08 LAB — HEMOGLOBIN A1C: Hgb A1c MFr Bld: 6.1 % (ref 4.6–6.5)

## 2015-02-09 ENCOUNTER — Encounter: Payer: Self-pay | Admitting: Internal Medicine

## 2015-02-12 ENCOUNTER — Ambulatory Visit
Admission: RE | Admit: 2015-02-12 | Discharge: 2015-02-12 | Disposition: A | Discharge status: Home / Self Care | Payer: BLUE CROSS/BLUE SHIELD | Source: Ambulatory Visit | Attending: Internal Medicine | Admitting: Internal Medicine

## 2015-02-12 ENCOUNTER — Encounter: Payer: Self-pay | Admitting: Internal Medicine

## 2015-02-12 ENCOUNTER — Ambulatory Visit (INDEPENDENT_AMBULATORY_CARE_PROVIDER_SITE_OTHER): Payer: BLUE CROSS/BLUE SHIELD | Admitting: Internal Medicine

## 2015-02-12 VITALS — BP 130/80 | HR 74 | Temp 98.3°F | Ht 71.25 in | Wt 287.2 lb

## 2015-02-12 DIAGNOSIS — R0602 Shortness of breath: Secondary | ICD-10-CM | POA: Insufficient documentation

## 2015-02-12 DIAGNOSIS — R5383 Other fatigue: Secondary | ICD-10-CM

## 2015-02-12 DIAGNOSIS — R945 Abnormal results of liver function studies: Secondary | ICD-10-CM

## 2015-02-12 DIAGNOSIS — R7989 Other specified abnormal findings of blood chemistry: Secondary | ICD-10-CM

## 2015-02-12 DIAGNOSIS — E7800 Pure hypercholesterolemia, unspecified: Secondary | ICD-10-CM

## 2015-02-12 DIAGNOSIS — K219 Gastro-esophageal reflux disease without esophagitis: Secondary | ICD-10-CM

## 2015-02-12 DIAGNOSIS — M79602 Pain in left arm: Secondary | ICD-10-CM

## 2015-02-12 DIAGNOSIS — M25512 Pain in left shoulder: Secondary | ICD-10-CM | POA: Diagnosis not present

## 2015-02-12 DIAGNOSIS — R079 Chest pain, unspecified: Secondary | ICD-10-CM | POA: Diagnosis not present

## 2015-02-12 DIAGNOSIS — G4733 Obstructive sleep apnea (adult) (pediatric): Secondary | ICD-10-CM

## 2015-02-12 DIAGNOSIS — J45909 Unspecified asthma, uncomplicated: Secondary | ICD-10-CM | POA: Insufficient documentation

## 2015-02-12 DIAGNOSIS — R739 Hyperglycemia, unspecified: Secondary | ICD-10-CM

## 2015-02-12 DIAGNOSIS — J209 Acute bronchitis, unspecified: Secondary | ICD-10-CM | POA: Insufficient documentation

## 2015-02-12 LAB — CBC WITH DIFFERENTIAL/PLATELET
Basophils Absolute: 0 10*3/uL (ref 0.0–0.1)
Basophils Relative: 0.4 % (ref 0.0–3.0)
EOS PCT: 1.2 % (ref 0.0–5.0)
Eosinophils Absolute: 0.1 10*3/uL (ref 0.0–0.7)
HCT: 42.5 % (ref 39.0–52.0)
HEMOGLOBIN: 14.5 g/dL (ref 13.0–17.0)
LYMPHS PCT: 33.9 % (ref 12.0–46.0)
Lymphs Abs: 2.1 10*3/uL (ref 0.7–4.0)
MCHC: 34.2 g/dL (ref 30.0–36.0)
MCV: 88.2 fl (ref 78.0–100.0)
MONOS PCT: 7.7 % (ref 3.0–12.0)
Monocytes Absolute: 0.5 10*3/uL (ref 0.1–1.0)
Neutro Abs: 3.6 10*3/uL (ref 1.4–7.7)
Neutrophils Relative %: 56.8 % (ref 43.0–77.0)
Platelets: 208 10*3/uL (ref 150.0–400.0)
RBC: 4.81 Mil/uL (ref 4.22–5.81)
RDW: 12.9 % (ref 11.5–15.5)
WBC: 6.3 10*3/uL (ref 4.0–10.5)

## 2015-02-12 LAB — HEPATIC FUNCTION PANEL
ALT: 55 U/L — AB (ref 0–53)
AST: 31 U/L (ref 0–37)
Albumin: 4.3 g/dL (ref 3.5–5.2)
Alkaline Phosphatase: 54 U/L (ref 39–117)
BILIRUBIN TOTAL: 0.4 mg/dL (ref 0.2–1.2)
Bilirubin, Direct: 0.1 mg/dL (ref 0.0–0.3)
Total Protein: 7.2 g/dL (ref 6.0–8.3)

## 2015-02-12 LAB — CK: CK TOTAL: 309 U/L — AB (ref 7–232)

## 2015-02-12 LAB — SEDIMENTATION RATE: Sed Rate: 22 mm/hr (ref 0–22)

## 2015-02-12 NOTE — Progress Notes (Signed)
Patient ID: Drema Pry II, male   DOB: Nov 11, 1975, 39 y.o.   MRN: [id number]   Subjective:    Patient ID: Drema Pry II, male    DOB: Apr 08, 1976, 39 y.o.   MRN: [id number]  HPI  Patient here for a scheduled follow up.  States that starting three weeks ago, noticed some chest pain after working.  Rested - ok.  Has been intermittent.  No actual pain with increased exertion.  States can occur when sitting still.  He has noticed increased pain in her left shoulder and upper arm.  Hurts if lies flat.  No pain when up working and waling. No pain with lifting.  Pain with reaching posteriorly.  Has noticed some upper extremity weakness.  States does not feel he can lift as well as he has been.  Still doing his work, just feels like it is harder to pick up things.  No sleeping well secondary to the pain in his shoulder.  Also has noticed some sob with exertion.  No increased congestion.  He feels his chest pain and breathing are related to not sleeping.  Eating and drinking well.  No nausea.  Bowels stable.  Recent elevated ALT.  Has gained weight.  Not watching his diet.  Not exercising.     Past Medical History  Diagnosis Date  . Esophagitis   . Duodenitis   . Hiatal hernia   . Hypercholesterolemia   . Nephrolithiasis   . GERD (gastroesophageal reflux disease)    Past Surgical History  Procedure Laterality Date  . Lithotripsy  2011   Family History  Problem Relation Age of Onset  . Alzheimer's disease Maternal Grandfather   . Colon cancer Neg Hx   . Diabetes      parent   Social History   Social History  . Marital Status: Married    Spouse Name: N/A  . Number of Children: 3  . Years of Education: N/A   Social History Main Topics  . Smoking status: Current Every Day Smoker    Last Attempt to Quit: 07/16/2008  . Smokeless tobacco: Current User    Types: Snuff     Comment: stopped in 04/15/14  . Alcohol Use: 0.0 oz/week    0 Standard drinks or equivalent per week   Comment: occasional  . Drug Use: No  . Sexual Activity: Not Asked   Other Topics Concern  . None   Social History Narrative    Outpatient Encounter Prescriptions as of 02/12/2015  Medication Sig  . calcium carbonate (TUMS - DOSED IN MG ELEMENTAL CALCIUM) 500 MG chewable tablet Chew 1 tablet by mouth daily as needed.   . cetirizine (ZYRTEC) 10 MG tablet Take 10 mg by mouth daily as needed.  Marland Kitchen omeprazole (PRILOSEC) 20 MG capsule Take 20 mg by mouth daily as needed.    No facility-administered encounter medications on file as of 02/12/2015.    Review of Systems  Constitutional: Negative for appetite change.       Has gained weight.  Discussed diet and exercise.    HENT: Negative for congestion and sinus pressure.   Eyes: Negative for discharge and visual disturbance.  Respiratory: Negative for shortness of breath.        Dyspnea on exertion.  Some intermittent chest discomfort.   Cardiovascular: Positive for chest pain. Negative for palpitations and leg swelling.  Gastrointestinal: Negative for nausea and abdominal pain.       Bowels stable.   Genitourinary: Negative for  dysuria and difficulty urinating.  Skin: Negative for color change and rash.  Neurological: Negative for dizziness, light-headedness and headaches.  Hematological: Negative for adenopathy. Does not bruise/bleed easily.  Psychiatric/Behavioral: Negative for dysphoric mood and agitation.       Objective:    Physical Exam  Constitutional: He appears well-developed and well-nourished. No distress.  HENT:  Nose: Nose normal.  Mouth/Throat: Oropharynx is clear and moist.  Eyes: Conjunctivae are normal. Right eye exhibits no discharge. Left eye exhibits no discharge.  Neck: Neck supple. No thyromegaly present.  Cardiovascular: Normal rate and regular rhythm.   Pulmonary/Chest: Effort normal and breath sounds normal. No respiratory distress.  Abdominal: Soft. Bowel sounds are normal. There is no tenderness.    Musculoskeletal: He exhibits no edema or tenderness.  Lymphadenopathy:    He has no cervical adenopathy.  Skin: No rash noted. No erythema.  Psychiatric: He has a normal mood and affect. His behavior is normal.    BP 130/80 mmHg  Pulse 74  Temp(Src) 98.3 F (36.8 C) (Oral)  Ht 5' 11.25" (1.81 m)  Wt 287 lb 4 oz (130.296 kg)  BMI 39.77 kg/m2  SpO2 96% Wt Readings from Last 3 Encounters:  02/12/15 287 lb 4 oz (130.296 kg)  04/17/14 276 lb 14 oz (125.59 kg)  04/17/13 266 lb 8 oz (120.884 kg)     Lab Results  Component Value Date   WBC 6.3 02/12/2015   HGB 14.5 02/12/2015   HCT 42.5 02/12/2015   PLT 208.0 02/12/2015   GLUCOSE 129* 02/08/2015   CHOL 234* 02/08/2015   TRIG 311.0* 02/08/2015   HDL 32.90* 02/08/2015   LDLDIRECT 149.0 02/08/2015   ALT 55* 02/12/2015   AST 31 02/12/2015   NA 141 02/08/2015   K 4.0 02/08/2015   CL 104 02/08/2015   CREATININE 0.80 02/15/2015   BUN 18 02/08/2015   CO2 27 02/08/2015   TSH 1.81 04/11/2014   PSA 0.90 04/11/2014   INR 0.9 09/04/2008   HGBA1C 6.1 02/08/2015       Assessment & Plan:   Problem List Items Addressed This Visit    Abnormal liver function test    Had previous ultrasound in 2010 that revealed fatty infiltration.  Follow liver panel.  Discussed diet, exercise and weight loss.  Discussed f/u ultrasound.  Will hold until can get the acute issues addressed.        Relevant Orders   Hepatic function panel (Completed)   Chest pain    Intermittent chest pain as outlined.  Unclear etiology.  EKG obtained and revealed SR with no acute ischemic changes.  Discussed my desire for further cardiac testing.  Discussed stress test and referral to cardiology.  He declines.  He feels related to not sleeping.  Check cxr.        Relevant Orders   EKG 12-Lead (Completed)   GERD (gastroesophageal reflux disease)    Has known esophagitis and duodenitis.  On omeprazole.  Symptoms appear to be controlled.        Hypercholesterolemia    Low cholesterol diet and exercise.  Discussed today.  LDL 149.  Will get above issues sorted through before starting a new cholesterol medication.  Follow.        Hyperglycemia    Low carb diet and exercise.  Follow met b and a1c.  Recent a1c 6.1.        Left shoulder pain    The only reproducible pain on exam is reaching posteriorly.  Will  check shoulder xray.  No neck pain on exam.  If shoulder xray unrevealing, will need c-spine xray.  Work on posture.  Check ck.        Relevant Orders   DG Shoulder Left (Completed)   Obstructive sleep apnea    Not using his CPAP regularly.  Discussed importance of using regularly and the risk of untreated sleep apnea.        SOB (shortness of breath) on exertion    Chest pain and sob on exertion as outlined.  EKG as outlined.  Declines further cardiac testing.  Check cxr.  Get him back in soon to reassess.  Discussed the need to use CPAP and weight loss.         Other Visit Diagnoses    SOB (shortness of breath)    -  Primary    Relevant Orders    DG Chest 2 View (Completed)    EKG 12-Lead (Completed)    Pain of left upper extremity        Relevant Orders    Sedimentation rate (Completed)    CK (Creatine Kinase) (Completed)    Other fatigue        Relevant Orders    CBC with Differential/Platelet (Completed)        Einar Pheasant, MD

## 2015-02-12 NOTE — Progress Notes (Signed)
Pre-visit discussion using our clinic review tool. No additional management support is needed unless otherwise documented below in the visit note.  

## 2015-02-13 ENCOUNTER — Encounter: Payer: Self-pay | Admitting: Internal Medicine

## 2015-02-13 DIAGNOSIS — R0602 Shortness of breath: Secondary | ICD-10-CM | POA: Insufficient documentation

## 2015-02-13 DIAGNOSIS — R079 Chest pain, unspecified: Secondary | ICD-10-CM | POA: Insufficient documentation

## 2015-02-13 NOTE — Assessment & Plan Note (Signed)
Intermittent chest pain as outlined.  Unclear etiology.  EKG obtained and revealed SR with no acute ischemic changes.  Discussed my desire for further cardiac testing.  Discussed stress test and referral to cardiology.  He declines.  He feels related to not sleeping.  Check cxr.

## 2015-02-13 NOTE — Assessment & Plan Note (Signed)
Not using his CPAP regularly.  Discussed importance of using regularly and the risk of untreated sleep apnea.

## 2015-02-13 NOTE — Assessment & Plan Note (Signed)
Has known esophagitis and duodenitis.  On omeprazole.  Symptoms appear to be controlled.

## 2015-02-13 NOTE — Assessment & Plan Note (Signed)
Chest pain and sob on exertion as outlined.  EKG as outlined.  Declines further cardiac testing.  Check cxr.  Get him back in soon to reassess.  Discussed the need to use CPAP and weight loss.

## 2015-02-14 ENCOUNTER — Encounter: Payer: Self-pay | Admitting: Internal Medicine

## 2015-02-15 ENCOUNTER — Other Ambulatory Visit (INDEPENDENT_AMBULATORY_CARE_PROVIDER_SITE_OTHER): Payer: BLUE CROSS/BLUE SHIELD

## 2015-02-15 ENCOUNTER — Other Ambulatory Visit: Payer: Self-pay | Admitting: Internal Medicine

## 2015-02-15 ENCOUNTER — Encounter: Payer: Self-pay | Admitting: Internal Medicine

## 2015-02-15 DIAGNOSIS — R748 Abnormal levels of other serum enzymes: Secondary | ICD-10-CM | POA: Diagnosis not present

## 2015-02-15 LAB — CK: CK TOTAL: 197 U/L (ref 7–232)

## 2015-02-15 LAB — CREATININE, SERUM: CREATININE: 0.8 mg/dL (ref 0.40–1.50)

## 2015-02-15 MED ORDER — ESOMEPRAZOLE MAGNESIUM 40 MG PO CPDR
40.0000 mg | DELAYED_RELEASE_CAPSULE | Freq: Every day | ORAL | Status: DC
Start: 2015-02-15 — End: 2017-03-15

## 2015-02-15 MED ORDER — CEFDINIR 300 MG PO CAPS: 300.0000 mg | ORAL_CAPSULE | Freq: Two times a day (BID) | ORAL | Status: DC

## 2015-02-15 NOTE — Telephone Encounter (Signed)
Called pt on the telephone.  Discussed xray results and lab results.  Declined medrol dose pack.  Agreed to abx.  Will repeat cxr at f/u appt.  He is feeling better.  Wants to hold on c-spine xray.  Will check at next visit if persistent pain.  Will also recheck cxr at appt at end of the month.  Stay hydrated.  Recheck ck 02/15/15.

## 2015-02-15 NOTE — Telephone Encounter (Signed)
rx sent in for nexium.  Pt notified via my chart.

## 2015-02-15 NOTE — Progress Notes (Signed)
Order placed for f/u labs.  rx sent in for omnicef.  See notes.

## 2015-02-16 ENCOUNTER — Encounter: Payer: Self-pay | Admitting: Internal Medicine

## 2015-02-18 ENCOUNTER — Encounter: Payer: Self-pay | Admitting: Internal Medicine

## 2015-02-18 DIAGNOSIS — M25512 Pain in left shoulder: Secondary | ICD-10-CM | POA: Insufficient documentation

## 2015-02-18 DIAGNOSIS — R945 Abnormal results of liver function studies: Secondary | ICD-10-CM | POA: Insufficient documentation

## 2015-02-18 DIAGNOSIS — R7989 Other specified abnormal findings of blood chemistry: Secondary | ICD-10-CM | POA: Insufficient documentation

## 2015-02-18 NOTE — Assessment & Plan Note (Signed)
The only reproducible pain on exam is reaching posteriorly.  Will check shoulder xray.  No neck pain on exam.  If shoulder xray unrevealing, will need c-spine xray.  Work on posture.  Check ck.

## 2015-02-18 NOTE — Assessment & Plan Note (Signed)
Had previous ultrasound in 2010 that revealed fatty infiltration.  Follow liver panel.  Discussed diet, exercise and weight loss.  Discussed f/u ultrasound.  Will hold until can get the acute issues addressed.

## 2015-02-18 NOTE — Assessment & Plan Note (Signed)
Low carb diet and exercise.  Follow met b and a1c.  Recent a1c 6.1.

## 2015-02-18 NOTE — Assessment & Plan Note (Signed)
Low cholesterol diet and exercise.  Discussed today.  LDL 149.  Will get above issues sorted through before starting a new cholesterol medication.  Follow.

## 2015-03-11 ENCOUNTER — Ambulatory Visit
Admission: RE | Admit: 2015-03-11 | Discharge: 2015-03-11 | Disposition: A | Discharge status: Home / Self Care | Payer: BLUE CROSS/BLUE SHIELD | Source: Ambulatory Visit | Attending: Internal Medicine | Admitting: Internal Medicine

## 2015-03-11 ENCOUNTER — Encounter: Payer: Self-pay | Admitting: Internal Medicine

## 2015-03-11 ENCOUNTER — Ambulatory Visit (INDEPENDENT_AMBULATORY_CARE_PROVIDER_SITE_OTHER): Payer: BLUE CROSS/BLUE SHIELD | Admitting: Internal Medicine

## 2015-03-11 VITALS — BP 120/80 | HR 86 | Temp 98.3°F | Resp 18 | Ht 71.25 in | Wt 284.8 lb

## 2015-03-11 DIAGNOSIS — R05 Cough: Secondary | ICD-10-CM

## 2015-03-11 DIAGNOSIS — M25512 Pain in left shoulder: Secondary | ICD-10-CM

## 2015-03-11 DIAGNOSIS — R7989 Other specified abnormal findings of blood chemistry: Secondary | ICD-10-CM

## 2015-03-11 DIAGNOSIS — M79602 Pain in left arm: Secondary | ICD-10-CM

## 2015-03-11 DIAGNOSIS — R938 Abnormal findings on diagnostic imaging of other specified body structures: Secondary | ICD-10-CM

## 2015-03-11 DIAGNOSIS — M5030 Other cervical disc degeneration, unspecified cervical region: Secondary | ICD-10-CM | POA: Insufficient documentation

## 2015-03-11 DIAGNOSIS — M1288 Other specific arthropathies, not elsewhere classified, other specified site: Secondary | ICD-10-CM | POA: Insufficient documentation

## 2015-03-11 DIAGNOSIS — R9389 Abnormal findings on diagnostic imaging of other specified body structures: Secondary | ICD-10-CM

## 2015-03-11 DIAGNOSIS — R059 Cough, unspecified: Secondary | ICD-10-CM

## 2015-03-11 DIAGNOSIS — K219 Gastro-esophageal reflux disease without esophagitis: Secondary | ICD-10-CM

## 2015-03-11 DIAGNOSIS — G4733 Obstructive sleep apnea (adult) (pediatric): Secondary | ICD-10-CM

## 2015-03-11 DIAGNOSIS — R945 Abnormal results of liver function studies: Secondary | ICD-10-CM

## 2015-03-11 NOTE — Progress Notes (Signed)
Pre-visit discussion using our clinic review tool. No additional management support is needed unless otherwise documented below in the visit note.  

## 2015-03-11 NOTE — Progress Notes (Signed)
Patient ID: Isaac Sims, male   DOB: Oct 20, 1975, 38 y.o.   MRN: [id number]   Subjective:    Patient ID: Isaac Sims, male    DOB: 21-Jul-1975, 39 y.o.   MRN: [id number]  HPI  Patient wit past history of OSA and GERD who comes in today for a scheduled follow up.  Recently evaluated for shoulder pain and sob.  See last note for details.  Xray shoulder negative.  Concern over neck origin.  Persistent pain in left shoulder and upper arm.  Extends down arm.  Not as coordinate.  No neck pain.  States when started abx, cough increased.  Cough productive of colored mucus.  Cough keeping him awake.  Has not been using his CPAP.  Cough is a little better today.  No vomiting.  Bowels stable.  Has had issues with taking prednisone previously.  Had wanted to hold on taking after last visit.     Past Medical History  Diagnosis Date  . Esophagitis   . Duodenitis   . Hiatal hernia   . Hypercholesterolemia   . Nephrolithiasis   . GERD (gastroesophageal reflux disease)    Past Surgical History  Procedure Laterality Date  . Lithotripsy  2011   Family History  Problem Relation Age of Onset  . Alzheimer's disease Maternal Grandfather   . Colon cancer Neg Hx   . Diabetes      parent   Social History   Social History  . Marital Status: Married    Spouse Name: N/A  . Number of Children: 3  . Years of Education: N/A   Social History Main Topics  . Smoking status: Current Every Day Smoker    Last Attempt to Quit: 07/16/2008  . Smokeless tobacco: Current User    Types: Snuff     Comment: stopped in 04/15/14  . Alcohol Use: 0.0 oz/week    0 Standard drinks or equivalent per week     Comment: occasional  . Drug Use: No  . Sexual Activity: Not Asked   Other Topics Concern  . None   Social History Narrative    Outpatient Encounter Prescriptions as of 03/11/2015  Medication Sig  . calcium carbonate (TUMS - DOSED IN MG ELEMENTAL CALCIUM) 500 MG chewable tablet Chew 1 tablet by  mouth daily as needed.   . cetirizine (ZYRTEC) 10 MG tablet Take 10 mg by mouth daily as needed.  Marland Kitchen esomeprazole (NEXIUM) 40 MG capsule Take 1 capsule (40 mg total) by mouth daily.  Marland Kitchen omeprazole (PRILOSEC) 20 MG capsule Take 20 mg by mouth daily as needed.   . [DISCONTINUED] cefdinir (OMNICEF) 300 MG capsule Take 1 capsule (300 mg total) by mouth 2 (two) times daily.   No facility-administered encounter medications on file as of 03/11/2015.    Review of Systems  Constitutional: Negative for appetite change and unexpected weight change.  HENT: Negative for congestion, sinus pressure and sore throat.   Eyes: Negative for pain and visual disturbance.  Respiratory: Positive for cough (increased cough and congestion.  productive colored mucus.  ). Negative for chest tightness and shortness of breath.   Cardiovascular: Negative for chest pain, palpitations and leg swelling.  Gastrointestinal: Negative for nausea, vomiting, abdominal pain and diarrhea.  Genitourinary: Negative for dysuria and difficulty urinating.  Musculoskeletal:       Increased pain - left shoulder and extending down left arm.    Skin: Negative for color change and rash.  Neurological: Negative for dizziness, light-headedness  and headaches.  Psychiatric/Behavioral: Negative for dysphoric mood and agitation.       Objective:    Physical Exam  Constitutional: He appears well-developed and well-nourished. No distress.  HENT:  Nose: Nose normal.  Mouth/Throat: Oropharynx is clear and moist.  Eyes: Conjunctivae are normal. Right eye exhibits no discharge. Left eye exhibits no discharge.  Neck: Neck supple.  Cardiovascular: Normal rate and regular rhythm.   Pulmonary/Chest: Effort normal and breath sounds normal. No respiratory distress.  Some increased cough with forced expiration.   Abdominal: Soft. Bowel sounds are normal. There is no tenderness.  Musculoskeletal: He exhibits no edema or tenderness.  No neck pain with  rotation of her head.  Increased pain with increased movement of shoulder.   Lymphadenopathy:    He has no cervical adenopathy.  Skin: No rash noted. No erythema.  Psychiatric: He has a normal mood and affect. His behavior is normal.    BP 120/80 mmHg  Pulse 86  Temp(Src) 98.3 F (36.8 C) (Oral)  Resp 18  Ht 5' 11.25" (1.81 m)  Wt 284 lb 12 oz (129.162 kg)  BMI 39.43 kg/m2  SpO2 95% Wt Readings from Last 3 Encounters:  03/11/15 284 lb 12 oz (129.162 kg)  02/12/15 287 lb 4 oz (130.296 kg)  04/17/14 276 lb 14 oz (125.59 kg)     Lab Results  Component Value Date   WBC 6.3 02/12/2015   HGB 14.5 02/12/2015   HCT 42.5 02/12/2015   PLT 208.0 02/12/2015   GLUCOSE 129* 02/08/2015   CHOL 234* 02/08/2015   TRIG 311.0* 02/08/2015   HDL 32.90* 02/08/2015   LDLDIRECT 149.0 02/08/2015   ALT 55* 02/12/2015   AST 31 02/12/2015   NA 141 02/08/2015   K 4.0 02/08/2015   CL 104 02/08/2015   CREATININE 0.80 02/15/2015   BUN 18 02/08/2015   CO2 27 02/08/2015   TSH 1.81 04/11/2014   PSA 0.90 04/11/2014   INR 0.9 09/04/2008   HGBA1C 6.1 02/08/2015    Dg Chest 2 View  02/12/2015   CLINICAL DATA:  Left-sided chest pain and left shoulder pain over the past several days associated with shortness of breath on exertion. No known injury. Current smoker.  EXAM: CHEST  2 VIEW  COMPARISON:  09/04/2008, 08/30/2008.  FINDINGS: Cardiac silhouette upper normal in size, perhaps minimally increased in size since the prior examinations even allowing for differences in technique. Hilar and mediastinal contours unremarkable. Mildly prominent bronchovascular markings diffusely and mild central peribronchial thickening, more so than on prior examinations. Lungs otherwise clear. Pulmonary vascularity normal. No visible pleural effusions. No pneumothorax. Mild eventration right anterior hemidiaphragm, unchanged. Visualized bony thorax intact.  IMPRESSION: Mild changes of acute bronchitis and/or asthma without focal  airspace pneumonia.   Electronically Signed   By: Hulan Saas M.D.   On: 02/12/2015 14:41   Dg Shoulder Left  02/12/2015   CLINICAL DATA:  Left shoulder pain, no injury  EXAM: LEFT SHOULDER - 2+ VIEW  COMPARISON:  None.  FINDINGS: There is no evidence of fracture or dislocation. There is no evidence of arthropathy or other focal bone abnormality. Soft tissues are unremarkable.  IMPRESSION: Negative.   Electronically Signed   By: Marlan Palau M.D.   On: 02/12/2015 14:39       Assessment & Plan:   Problem List Items Addressed This Visit    Abnormal liver function test    Previous ultrasound revealed fatty infiltration.  Follow.  Cough - Primary    Persistent cough, keeping him awake at night.  Previous abnormal cxr as outlined.  Recheck cxr.  Needs steroids.  Discussed with him today.  Will try medrol dose pack. Follow.        Relevant Orders   DG Chest 2 View (Completed)   GERD (gastroesophageal reflux disease)    Has known esophagitis and duodenitis.  On omeprazole.  No upper GI symptoms.        Left shoulder pain    Persistent increased left shoulder pain and arm pain.  Feel related to his neck.  Check c-spine xray.  Further w/up pending results.       Relevant Orders   DG Cervical Spine 2 or 3 views (Completed)   Obstructive sleep apnea    Not using his CPAP regularly.  Discussed the need to use and also discussed avoidance of sedating medication given h/o sleep apnea.        Other Visit Diagnoses    Left arm pain        Relevant Orders    DG Cervical Spine 2 or 3 views (Completed)    Abnormal CXR        Relevant Orders    DG Chest 2 View (Completed)        Dale [place], MD

## 2015-03-11 NOTE — Patient Instructions (Signed)
Zantac  - take one tablet 30 minutes before breakfast and one tablet 30 minutes before evening meal.

## 2015-03-13 ENCOUNTER — Encounter: Payer: Self-pay | Admitting: *Deleted

## 2015-03-13 ENCOUNTER — Other Ambulatory Visit: Payer: Self-pay | Admitting: *Deleted

## 2015-03-13 MED ORDER — LEVOFLOXACIN 500 MG PO TABS: 500.0000 mg | ORAL_TABLET | Freq: Every day | ORAL | Status: DC

## 2015-03-13 NOTE — Telephone Encounter (Signed)
Sent Levaquin 500 mg daily x 10 days to [facility]

## 2015-03-15 ENCOUNTER — Encounter: Payer: Self-pay | Admitting: Internal Medicine

## 2015-03-15 NOTE — Assessment & Plan Note (Signed)
Previous ultrasound revealed fatty infiltration.  Follow.

## 2015-03-15 NOTE — Assessment & Plan Note (Signed)
Has known esophagitis and duodenitis.  On omeprazole.  No upper GI symptoms.

## 2015-03-15 NOTE — Assessment & Plan Note (Signed)
Persistent increased left shoulder pain and arm pain.  Feel related to his neck.  Check c-spine xray.  Further w/up pending results.

## 2015-03-15 NOTE — Assessment & Plan Note (Signed)
Not using his CPAP regularly.  Discussed the need to use and also discussed avoidance of sedating medication given h/o sleep apnea.

## 2015-03-15 NOTE — Assessment & Plan Note (Signed)
Persistent cough, keeping him awake at night.  Previous abnormal cxr as outlined.  Recheck cxr.  Needs steroids.  Discussed with him today.  Will try medrol dose pack. Follow.

## 2015-04-08 ENCOUNTER — Ambulatory Visit (INDEPENDENT_AMBULATORY_CARE_PROVIDER_SITE_OTHER): Payer: BLUE CROSS/BLUE SHIELD | Admitting: Internal Medicine

## 2015-04-08 ENCOUNTER — Encounter: Payer: Self-pay | Admitting: Internal Medicine

## 2015-04-08 VITALS — BP 112/80 | HR 72 | Temp 98.2°F | Resp 18 | Ht 71.25 in | Wt 272.0 lb

## 2015-04-08 DIAGNOSIS — R7989 Other specified abnormal findings of blood chemistry: Secondary | ICD-10-CM | POA: Diagnosis not present

## 2015-04-08 DIAGNOSIS — R945 Abnormal results of liver function studies: Secondary | ICD-10-CM

## 2015-04-08 DIAGNOSIS — R9389 Abnormal findings on diagnostic imaging of other specified body structures: Secondary | ICD-10-CM

## 2015-04-08 DIAGNOSIS — R938 Abnormal findings on diagnostic imaging of other specified body structures: Secondary | ICD-10-CM

## 2015-04-08 DIAGNOSIS — Z125 Encounter for screening for malignant neoplasm of prostate: Secondary | ICD-10-CM

## 2015-04-08 DIAGNOSIS — E7800 Pure hypercholesterolemia, unspecified: Secondary | ICD-10-CM | POA: Diagnosis not present

## 2015-04-08 DIAGNOSIS — R739 Hyperglycemia, unspecified: Secondary | ICD-10-CM

## 2015-04-08 DIAGNOSIS — Z720 Tobacco use: Secondary | ICD-10-CM

## 2015-04-08 DIAGNOSIS — M79602 Pain in left arm: Secondary | ICD-10-CM

## 2015-04-08 DIAGNOSIS — K219 Gastro-esophageal reflux disease without esophagitis: Secondary | ICD-10-CM

## 2015-04-08 DIAGNOSIS — M25512 Pain in left shoulder: Secondary | ICD-10-CM

## 2015-04-08 NOTE — Progress Notes (Signed)
Patient ID: Isaac Sims, male   DOB: November 22, 1975, 39 y.o.   MRN: [id number]   Subjective:    Patient ID: Isaac Sims, male    DOB: 18-Sep-1975, 39 y.o.   MRN: [id number]  HPI  Patient with past history of esophagitis, hiatal hernia, GERD and hypercholesterolemia.  He comes in today to follow up on these issues.  Was recently evaluated for persistent cough and congestion.  Treated with medrol dose pack and abx.  See last note for details.  Abnormal cxr.  Needs f/u cxr.  He declines now.  States he will notify me when agreeable for f/u cxr.  Cough is better.  No increased cough or congestion.  No sob.  Had adjusted his diet.  Lost weight. Still with left shoulder and arm pain and discomfort.  See previous note for details.  Medrol dose pack did help.  Still with pain.  Some weakness when trying to left with extended arm.  No chest pain or tightness.  Bowels stable.     Past Medical History  Diagnosis Date  . Esophagitis   . Duodenitis   . Hiatal hernia   . Hypercholesterolemia   . Nephrolithiasis   . GERD (gastroesophageal reflux disease)    Past Surgical History  Procedure Laterality Date  . Lithotripsy  2011   Family History  Problem Relation Age of Onset  . Alzheimer's disease Maternal Grandfather   . Colon cancer Neg Hx   . Diabetes      parent   Social History   Social History  . Marital Status: Married    Spouse Name: N/A  . Number of Children: 3  . Years of Education: N/A   Social History Main Topics  . Smoking status: Current Every Day Smoker    Last Attempt to Quit: 07/16/2008  . Smokeless tobacco: Current User    Types: Snuff     Comment: stopped in 04/15/14  . Alcohol Use: 0.0 oz/week    0 Standard drinks or equivalent per week     Comment: occasional  . Drug Use: No  . Sexual Activity: Not Asked   Other Topics Concern  . None   Social History Narrative    Outpatient Encounter Prescriptions as of 04/08/2015  Medication Sig  . calcium  carbonate (TUMS - DOSED IN MG ELEMENTAL CALCIUM) 500 MG chewable tablet Chew 1 tablet by mouth daily as needed.   . cetirizine (ZYRTEC) 10 MG tablet Take 10 mg by mouth daily as needed.  Marland Kitchen esomeprazole (NEXIUM) 40 MG capsule Take 1 capsule (40 mg total) by mouth daily.  Marland Kitchen omeprazole (PRILOSEC) 20 MG capsule Take 20 mg by mouth daily as needed.   . [DISCONTINUED] levofloxacin (LEVAQUIN) 500 MG tablet Take 1 tablet (500 mg total) by mouth daily.   No facility-administered encounter medications on file as of 04/08/2015.    Review of Systems  Constitutional: Negative for appetite change and unexpected weight change.  HENT: Negative for congestion and sinus pressure.   Eyes: Negative for discharge and visual disturbance.  Respiratory: Negative for cough, chest tightness and shortness of breath.   Cardiovascular: Negative for chest pain, palpitations and leg swelling.  Gastrointestinal: Negative for nausea, vomiting, abdominal pain and diarrhea.  Genitourinary: Negative for dysuria and difficulty urinating.  Musculoskeletal:       Left shoulder and left arm pain as outlined.    Skin: Negative for color change and rash.  Neurological: Negative for dizziness, light-headedness and headaches.  Psychiatric/Behavioral:  Negative for dysphoric mood and agitation.       Objective:    Physical Exam  Constitutional: He appears well-developed and well-nourished. No distress.  HENT:  Nose: Nose normal.  Mouth/Throat: Oropharynx is clear and moist.  Eyes: Conjunctivae are normal. Right eye exhibits no discharge. Left eye exhibits no discharge.  Neck: Neck supple. No thyromegaly present.  Cardiovascular: Normal rate and regular rhythm.   Pulmonary/Chest: Effort normal and breath sounds normal. No respiratory distress.  Abdominal: Soft. Bowel sounds are normal. There is no tenderness.  Musculoskeletal: He exhibits no edema or tenderness.  Lymphadenopathy:    He has no cervical adenopathy.  Skin: No  rash noted. No erythema.  Psychiatric: He has a normal mood and affect. His behavior is normal.    BP 112/80 mmHg  Pulse 72  Temp(Src) 98.2 F (36.8 C) (Oral)  Resp 18  Ht 5' 11.25" (1.81 m)  Wt 272 lb (123.378 kg)  BMI 37.66 kg/m2  SpO2 95% Wt Readings from Last 3 Encounters:  04/08/15 272 lb (123.378 kg)  03/11/15 284 lb 12 oz (129.162 kg)  02/12/15 287 lb 4 oz (130.296 kg)     Lab Results  Component Value Date   WBC 6.3 02/12/2015   HGB 14.5 02/12/2015   HCT 42.5 02/12/2015   PLT 208.0 02/12/2015   GLUCOSE 129* 02/08/2015   CHOL 234* 02/08/2015   TRIG 311.0* 02/08/2015   HDL 32.90* 02/08/2015   LDLDIRECT 149.0 02/08/2015   ALT 55* 02/12/2015   AST 31 02/12/2015   NA 141 02/08/2015   K 4.0 02/08/2015   CL 104 02/08/2015   CREATININE 0.80 02/15/2015   BUN 18 02/08/2015   CO2 27 02/08/2015   TSH 1.81 04/11/2014   PSA 0.90 04/11/2014   INR 0.9 09/04/2008   HGBA1C 6.1 02/08/2015    Dg Chest 2 View  03/12/2015  CLINICAL DATA:  Cough congestion for 1.5 weeks, has been taking antibiotics: Patient smokes EXAM: CHEST  2 VIEW COMPARISON:  02/12/2015 FINDINGS: The heart size and vascular pattern are normal. Stable mildly increased bronchovascular markings on the right and mild peribronchial wall thickening bilaterally. Mildly increased markings in the left lower lobe when compared to the prior study. No pleural effusion. IMPRESSION: Continued bronchitic change. Mildly increased markings focally in the left lower lobe when compared to recent prior study. Subtle infiltrate related to pneumonia not excluded. Electronically Signed   By: Skipper Cliche M.D.   On: 03/12/2015 08:27   Dg Cervical Spine 2 Or 3 Views  03/12/2015  CLINICAL DATA:  Six months of neck and left shoulder pain intermittently radiating to the left elbow. No reported injury. EXAM: CERVICAL SPINE  4+ VIEWS COMPARISON:  11/07/2008 neck CT. FINDINGS: There is straightening of the cervical spine. The cervical spine  is visualized to the level of the lower C7 endplate on the lateral view. The prevertebral soft tissues are within normal limits. Cervical vertebral body heights are preserved, with no fracture, subluxation or suspicious focal osseous lesion. There is mild loss of disc height at C5-6. There are minimal spondylotic changes throughout the cervical disc spaces. The dens is well positioned between the lateral masses of C1. There is mild facet arthropathy in lower cervical spine, asymmetric to the left. IMPRESSION: 1. Straightening of the cervical spine, usually due to the positioning and/or muscle spasm. 2. Mild multilevel degenerative disc disease in the cervical spine, most prominent at C5-6. 3. Mild facet arthropathy in the lower cervical spine, asymmetric to the  left. Electronically Signed   By: Ilona Sorrel M.D.   On: 03/12/2015 08:29       Assessment & Plan:   Problem List Items Addressed This Visit    Abnormal CXR    Needs f/u cxr.  Discussed with him today.  He declines.  Will notify me when agreeable.        Abnormal liver function test    Has adjusted his diet.  Lost weight.  Previous ultrasound revealed fatty infiltration.  Follow liver panel.         Relevant Orders   Hepatic function panel   GERD (gastroesophageal reflux disease)    No acid reflux.  On omeprazole.  Follow.        Hypercholesterolemia    Low cholesterol diet and exercise.  Follow lipid panel.        Relevant Orders   Lipid panel   Hyperglycemia    Low carb diet and exercise.  Follow met b and a1c.       Relevant Orders   TSH   Hemoglobin W8E   Basic metabolic panel   Left shoulder pain - Primary    Left shoulder and arm pain.  c-spine xray as outlined.  Medrol dose pack helped.  Persistent symptoms.  Some weakness when picking up objects with outstretched arm.  Needs c-spine MRI.        Relevant Orders   MR Cervical Spine Wo Contrast   Tobacco use    Discussed the need to quit smoking.  Follow.          Other Visit Diagnoses    Left arm pain        Relevant Orders    MR Cervical Spine Wo Contrast    Prostate cancer screening        Relevant Orders    PSA        Einar Pheasant, MD

## 2015-04-08 NOTE — Progress Notes (Signed)
Pre-visit discussion using our clinic review tool. No additional management support is needed unless otherwise documented below in the visit note.  

## 2015-04-14 ENCOUNTER — Encounter: Payer: Self-pay | Admitting: Internal Medicine

## 2015-04-14 DIAGNOSIS — R9389 Abnormal findings on diagnostic imaging of other specified body structures: Secondary | ICD-10-CM | POA: Insufficient documentation

## 2015-04-14 NOTE — Assessment & Plan Note (Signed)
No acid reflux.  On omeprazole.  Follow.   

## 2015-04-14 NOTE — Assessment & Plan Note (Signed)
Low cholesterol diet and exercise.  Follow lipid panel.   

## 2015-04-14 NOTE — Assessment & Plan Note (Signed)
Has adjusted his diet.  Lost weight.  Previous ultrasound revealed fatty infiltration.  Follow liver panel.

## 2015-04-14 NOTE — Assessment & Plan Note (Signed)
Left shoulder and arm pain.  c-spine xray as outlined.  Medrol dose pack helped.  Persistent symptoms.  Some weakness when picking up objects with outstretched arm.  Needs c-spine MRI.

## 2015-04-14 NOTE — Assessment & Plan Note (Signed)
Discussed the need to quit smoking. Follow.  

## 2015-04-14 NOTE — Assessment & Plan Note (Signed)
Low carb diet and exercise.  Follow met b and a1c.  

## 2015-04-14 NOTE — Assessment & Plan Note (Signed)
Needs f/u cxr.  Discussed with him today.  He declines.  Will notify me when agreeable.

## 2015-04-24 ENCOUNTER — Ambulatory Visit: Payer: BLUE CROSS/BLUE SHIELD

## 2015-05-16 ENCOUNTER — Telehealth: Payer: Self-pay | Admitting: *Deleted

## 2015-05-16 NOTE — Telephone Encounter (Signed)
He needs to let me know when he wants to reschedule.  Thanks

## 2015-05-16 NOTE — Telephone Encounter (Signed)
LM for patient to return call.

## 2015-05-16 NOTE — Telephone Encounter (Signed)
[facility] that he had to cancel his MRI, due to personal issues.

## 2015-05-16 NOTE — Telephone Encounter (Signed)
Spoke with patient and he wants to wait and talk to you in March during his physical. Due to insurance change he thinks that it is best to hold off for right now.

## 2015-05-16 NOTE — Telephone Encounter (Signed)
FYI

## 2015-05-20 ENCOUNTER — Ambulatory Visit: Payer: BLUE CROSS/BLUE SHIELD

## 2015-08-14 ENCOUNTER — Other Ambulatory Visit: Payer: BLUE CROSS/BLUE SHIELD

## 2015-08-16 ENCOUNTER — Encounter: Payer: BLUE CROSS/BLUE SHIELD | Admitting: Internal Medicine

## 2015-11-26 IMAGING — CR DG CHEST 2V
1 series · 2 of 2 positions shown · non-contrast
Comparison: 09/04/2008, 08/30/2008.

CLINICAL DATA: Left-sided chest pain and left shoulder pain over
the past several days associated with shortness of breath on
exertion. No known injury. Current smoker.

EXAM:
CHEST  2 VIEW

[Series 1: dg chest 2 view · 0.14mm/px · 2 of 2 slices shown]
[im 1/2]
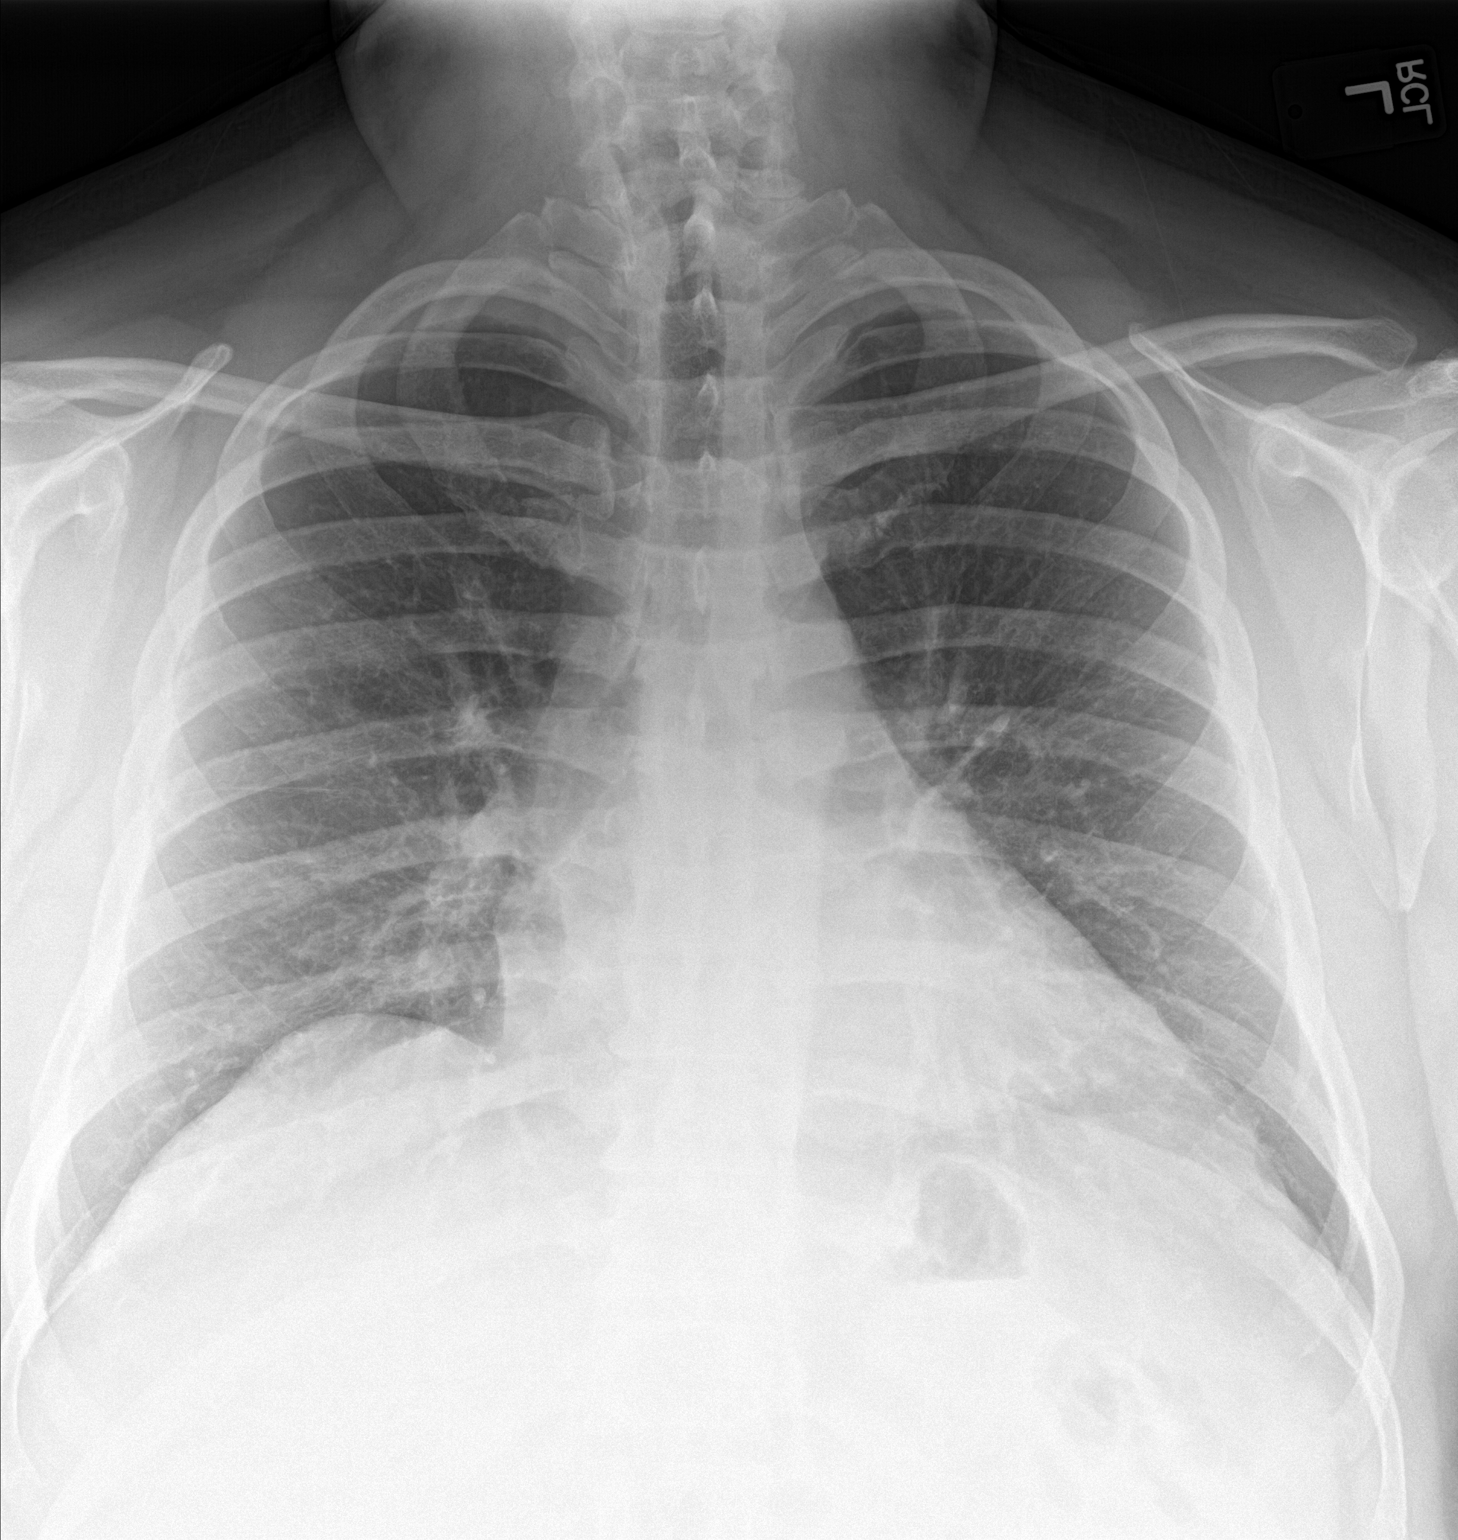
[im 2/2]
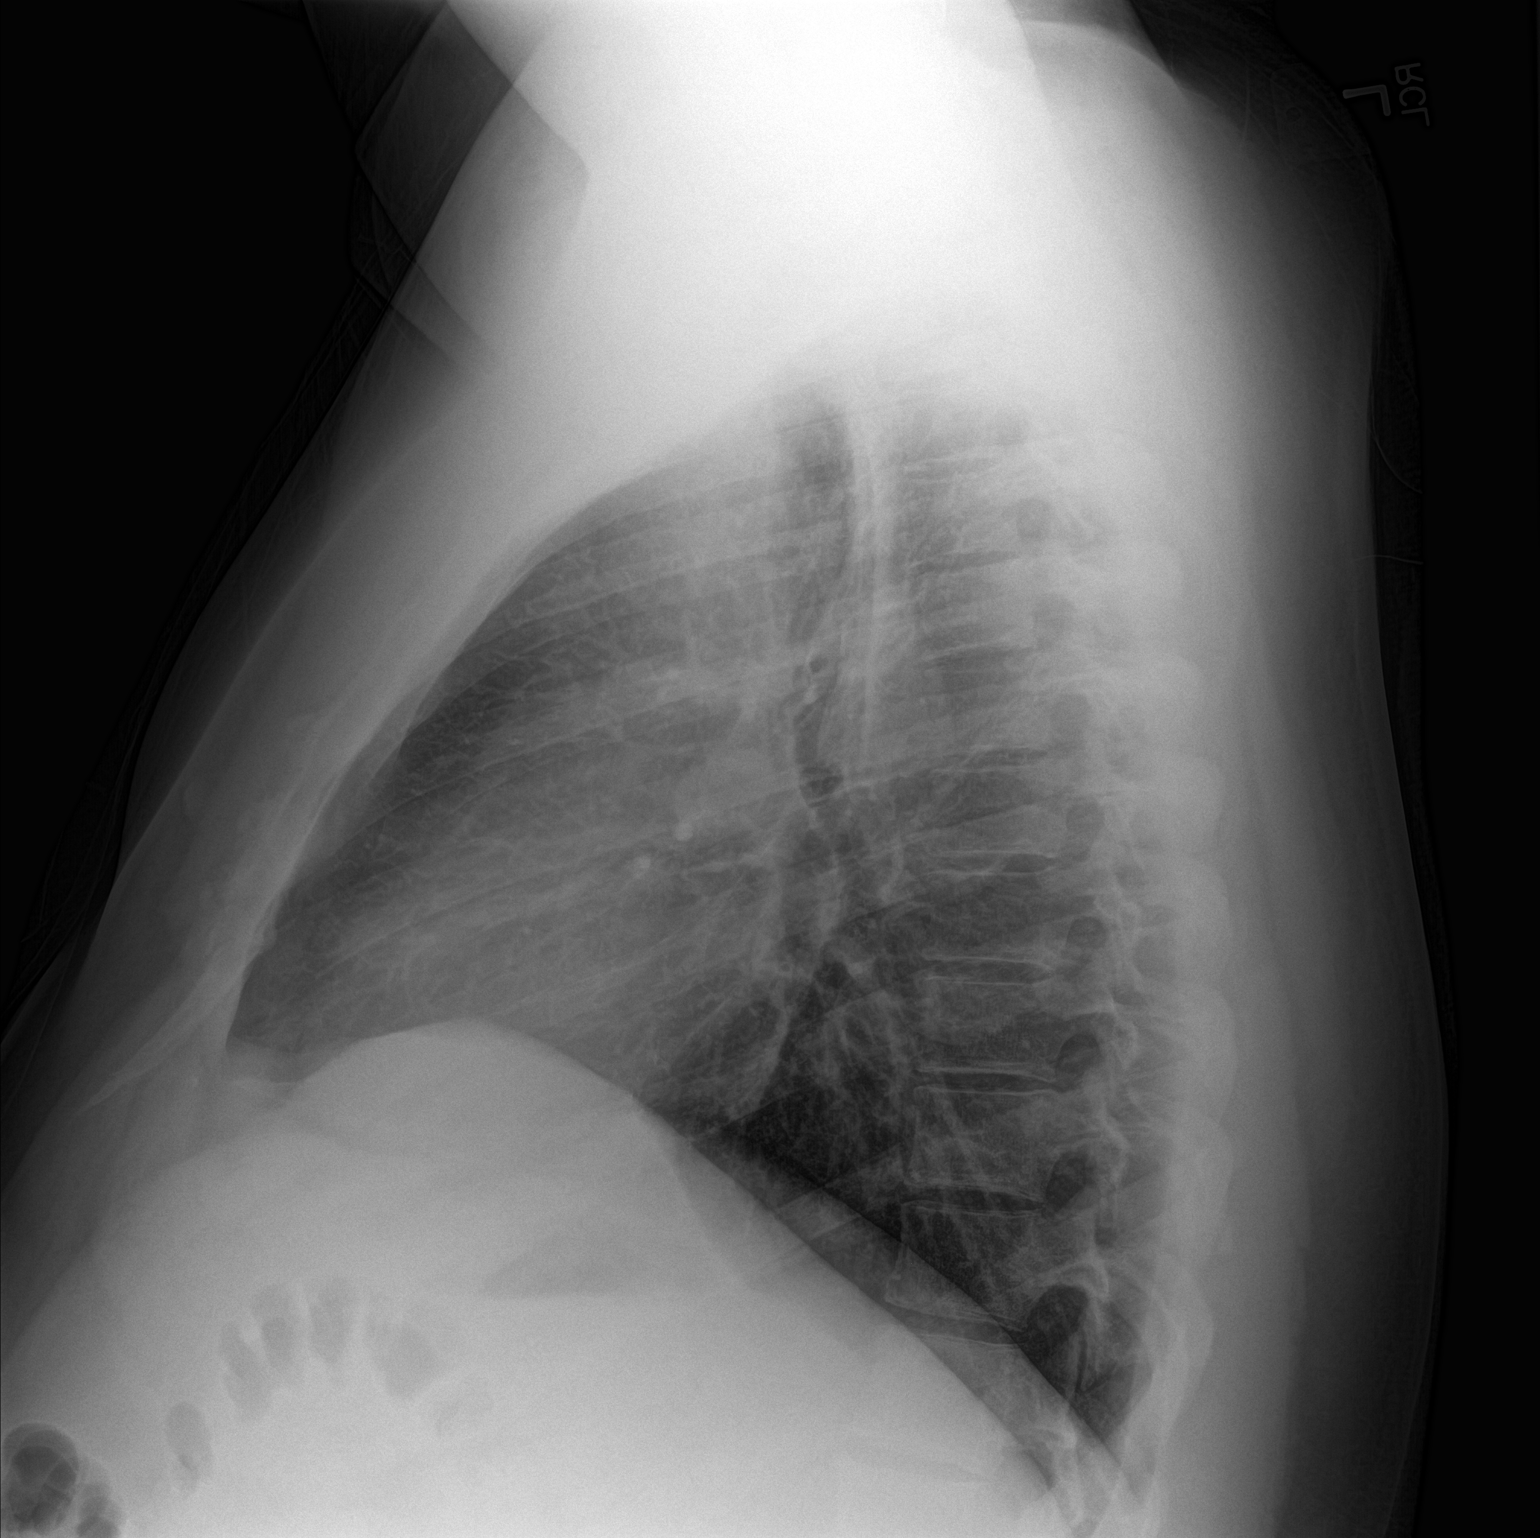

[2 of 2 positions shown; findings below may reference images not displayed]

FINDINGS: Cardiac silhouette upper normal in size, perhaps minimally increased
in size since the prior examinations even allowing for differences
in technique. Hilar and mediastinal contours unremarkable. Mildly
prominent bronchovascular markings diffusely and mild central
peribronchial thickening, more so than on prior examinations. Lungs
otherwise clear. Pulmonary vascularity normal. No visible pleural
effusions. No pneumothorax. Mild eventration right anterior
hemidiaphragm, unchanged. Visualized bony thorax intact.
IMPRESSION: Mild changes of acute bronchitis and/or asthma without focal
airspace pneumonia.

## 2015-11-26 IMAGING — CR DG SHOULDER 2+V*L*
1 series · 3 of 3 positions shown · non-contrast
Comparison: None.

CLINICAL DATA: Left shoulder pain, no injury

EXAM:
LEFT SHOULDER - 2+ VIEW

[Series 1: dg shoulder left · 0.14mm/px · 3 of 3 slices shown]
[im 1/3]
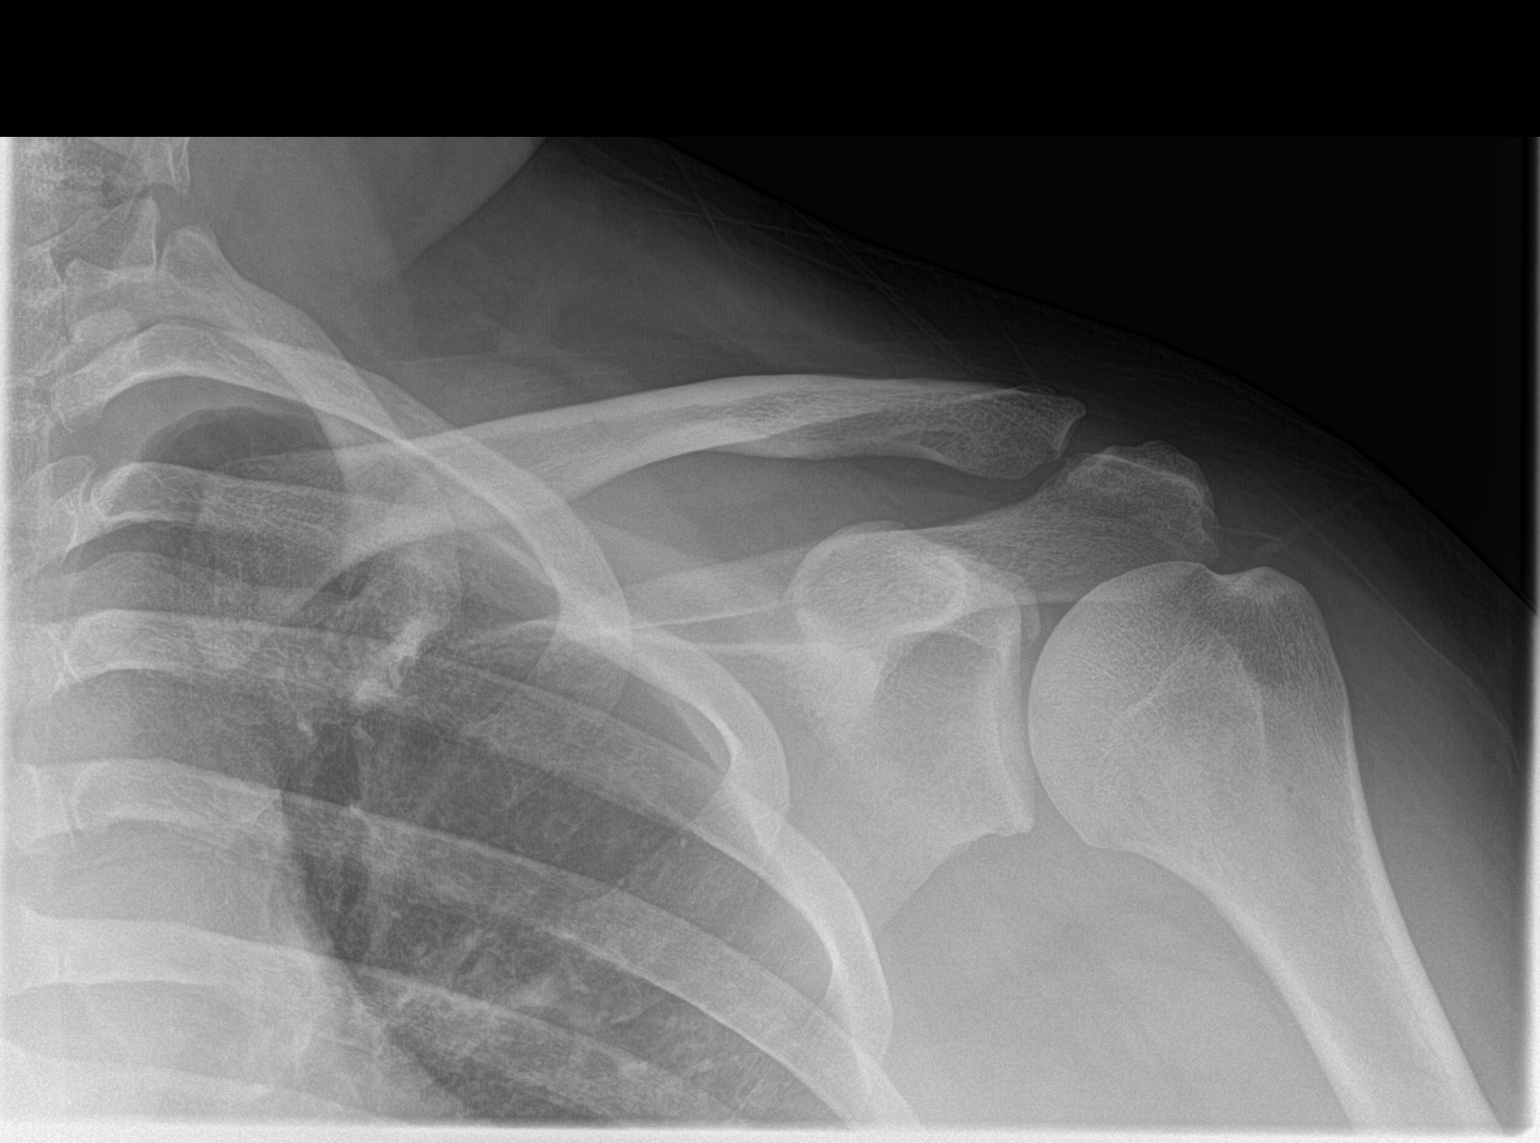
[im 2/3]
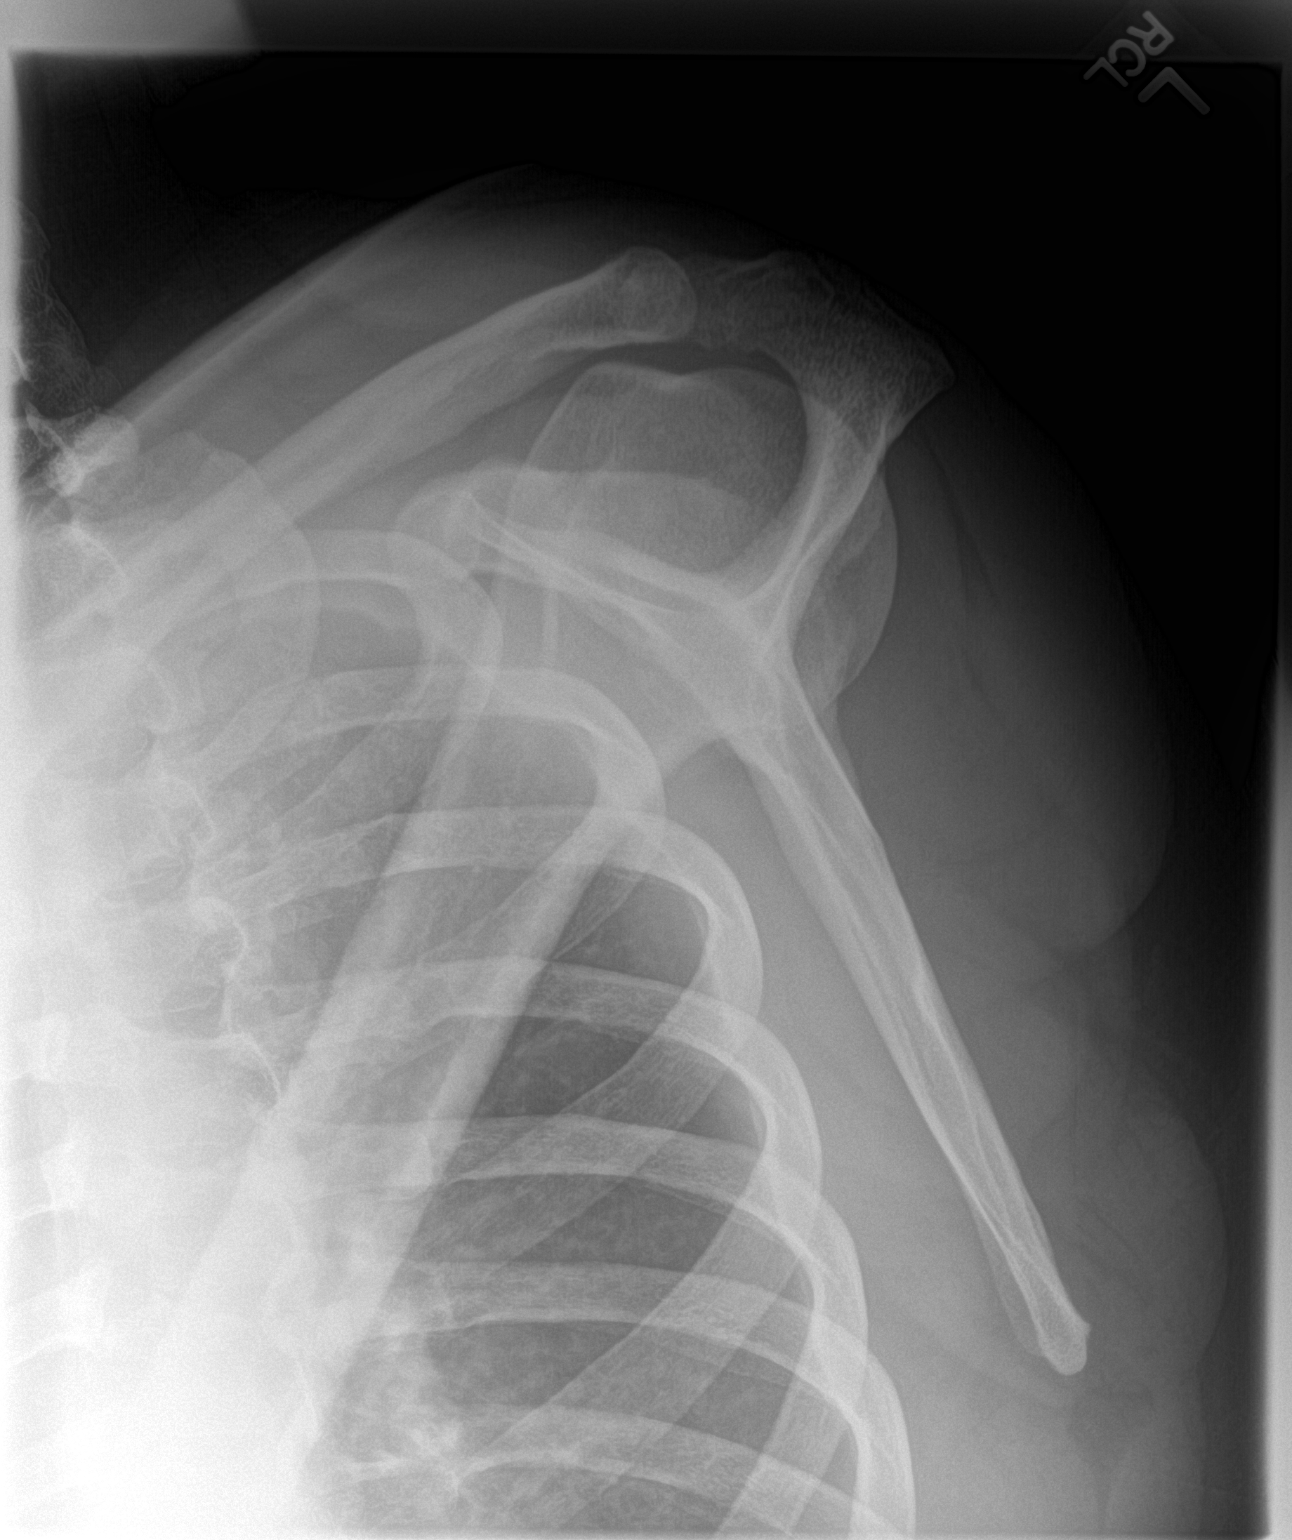
[im 3/3]
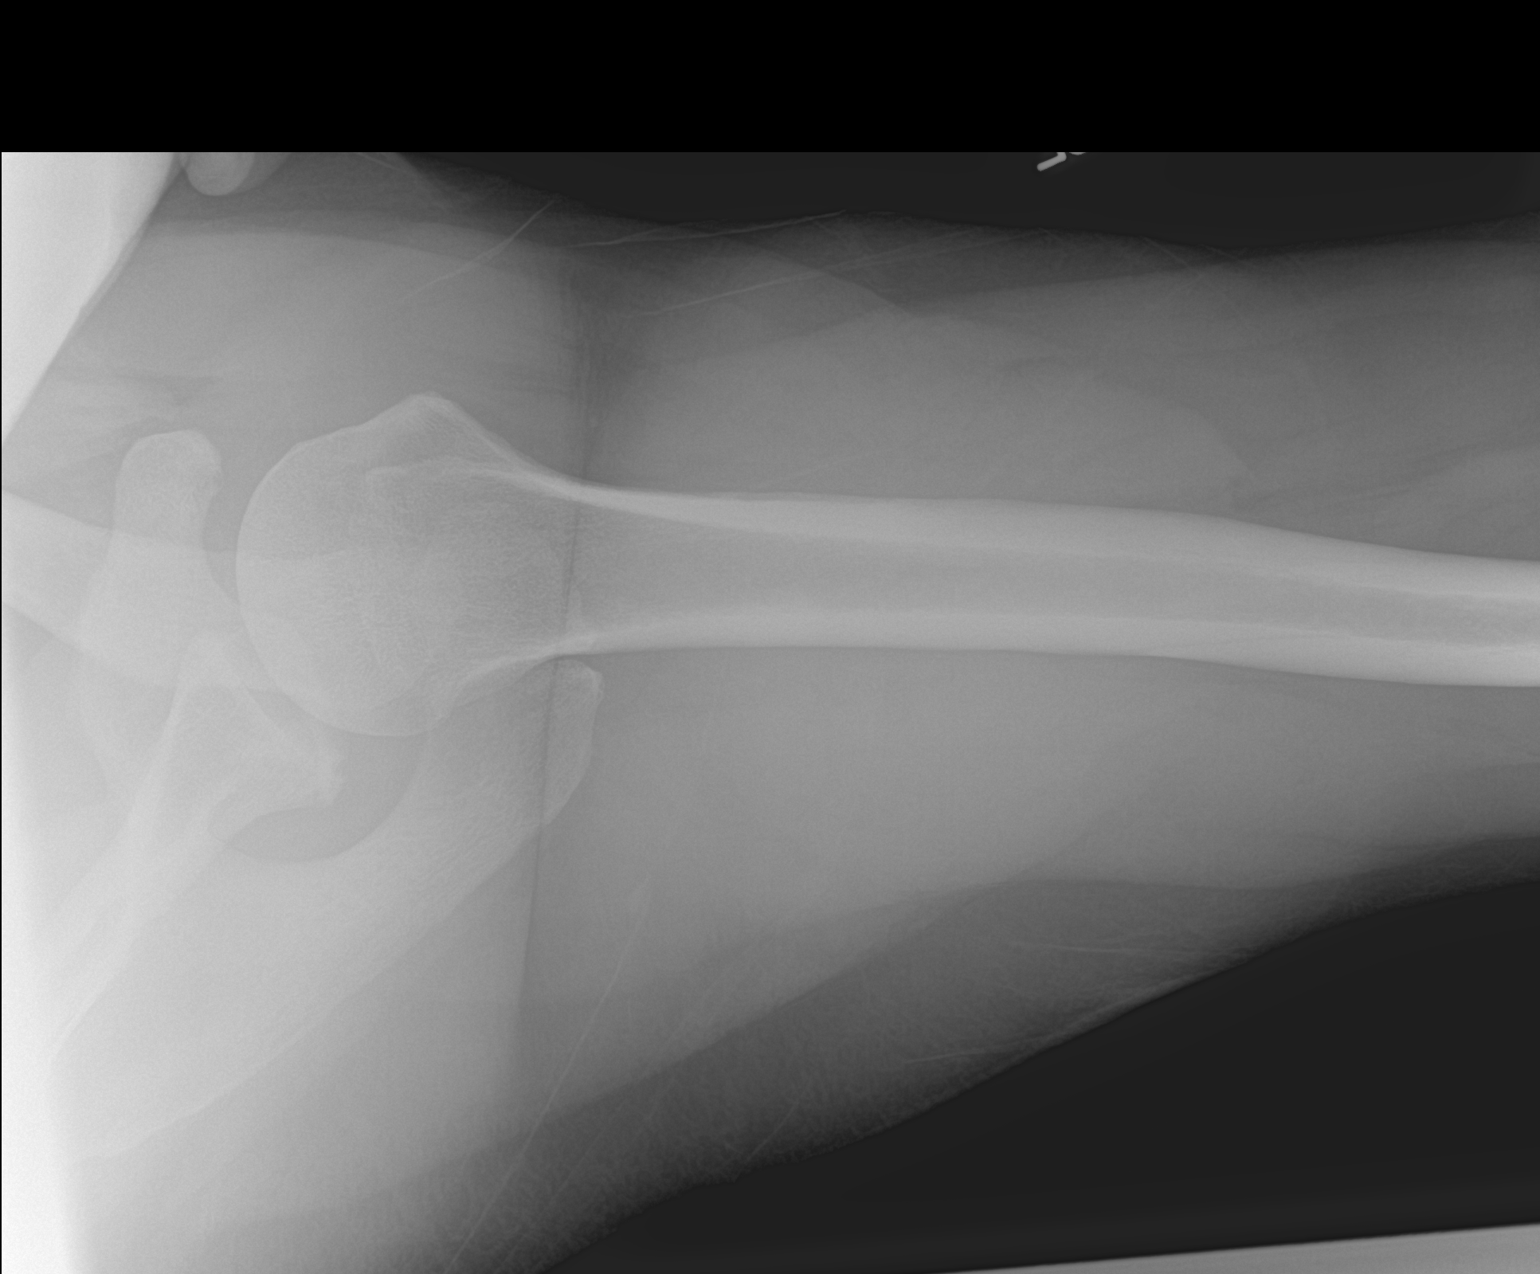

[3 of 3 positions shown; findings below may reference images not displayed]

FINDINGS: There is no evidence of fracture or dislocation. There is no
evidence of arthropathy or other focal bone abnormality. Soft
tissues are unremarkable.
IMPRESSION: Negative.

## 2015-12-23 IMAGING — CR DG CHEST 2V
2 series · 2 of 2 positions shown · non-contrast
Comparison: 02/12/2015

CLINICAL DATA: Cough congestion for 1.5 weeks, has been taking
antibiotics: Patient smokes

EXAM:
CHEST  2 VIEW

[chest pa]
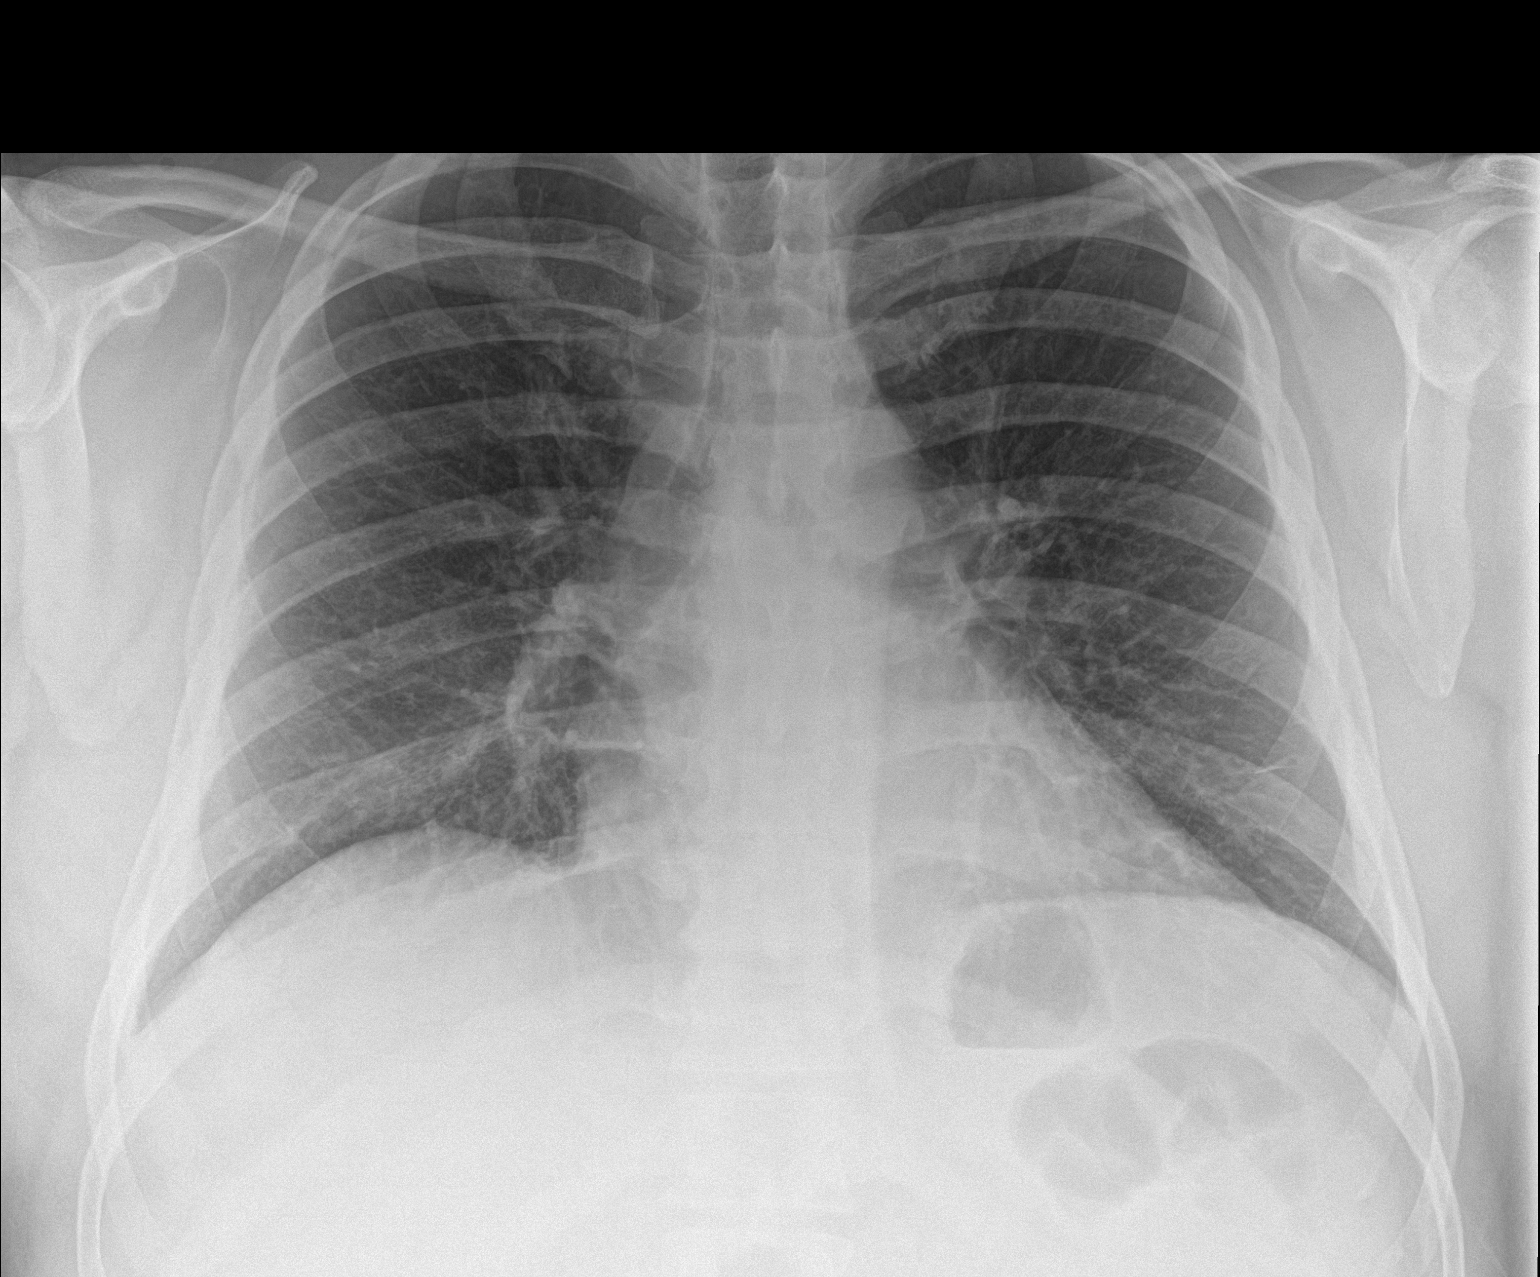

[chest lat]
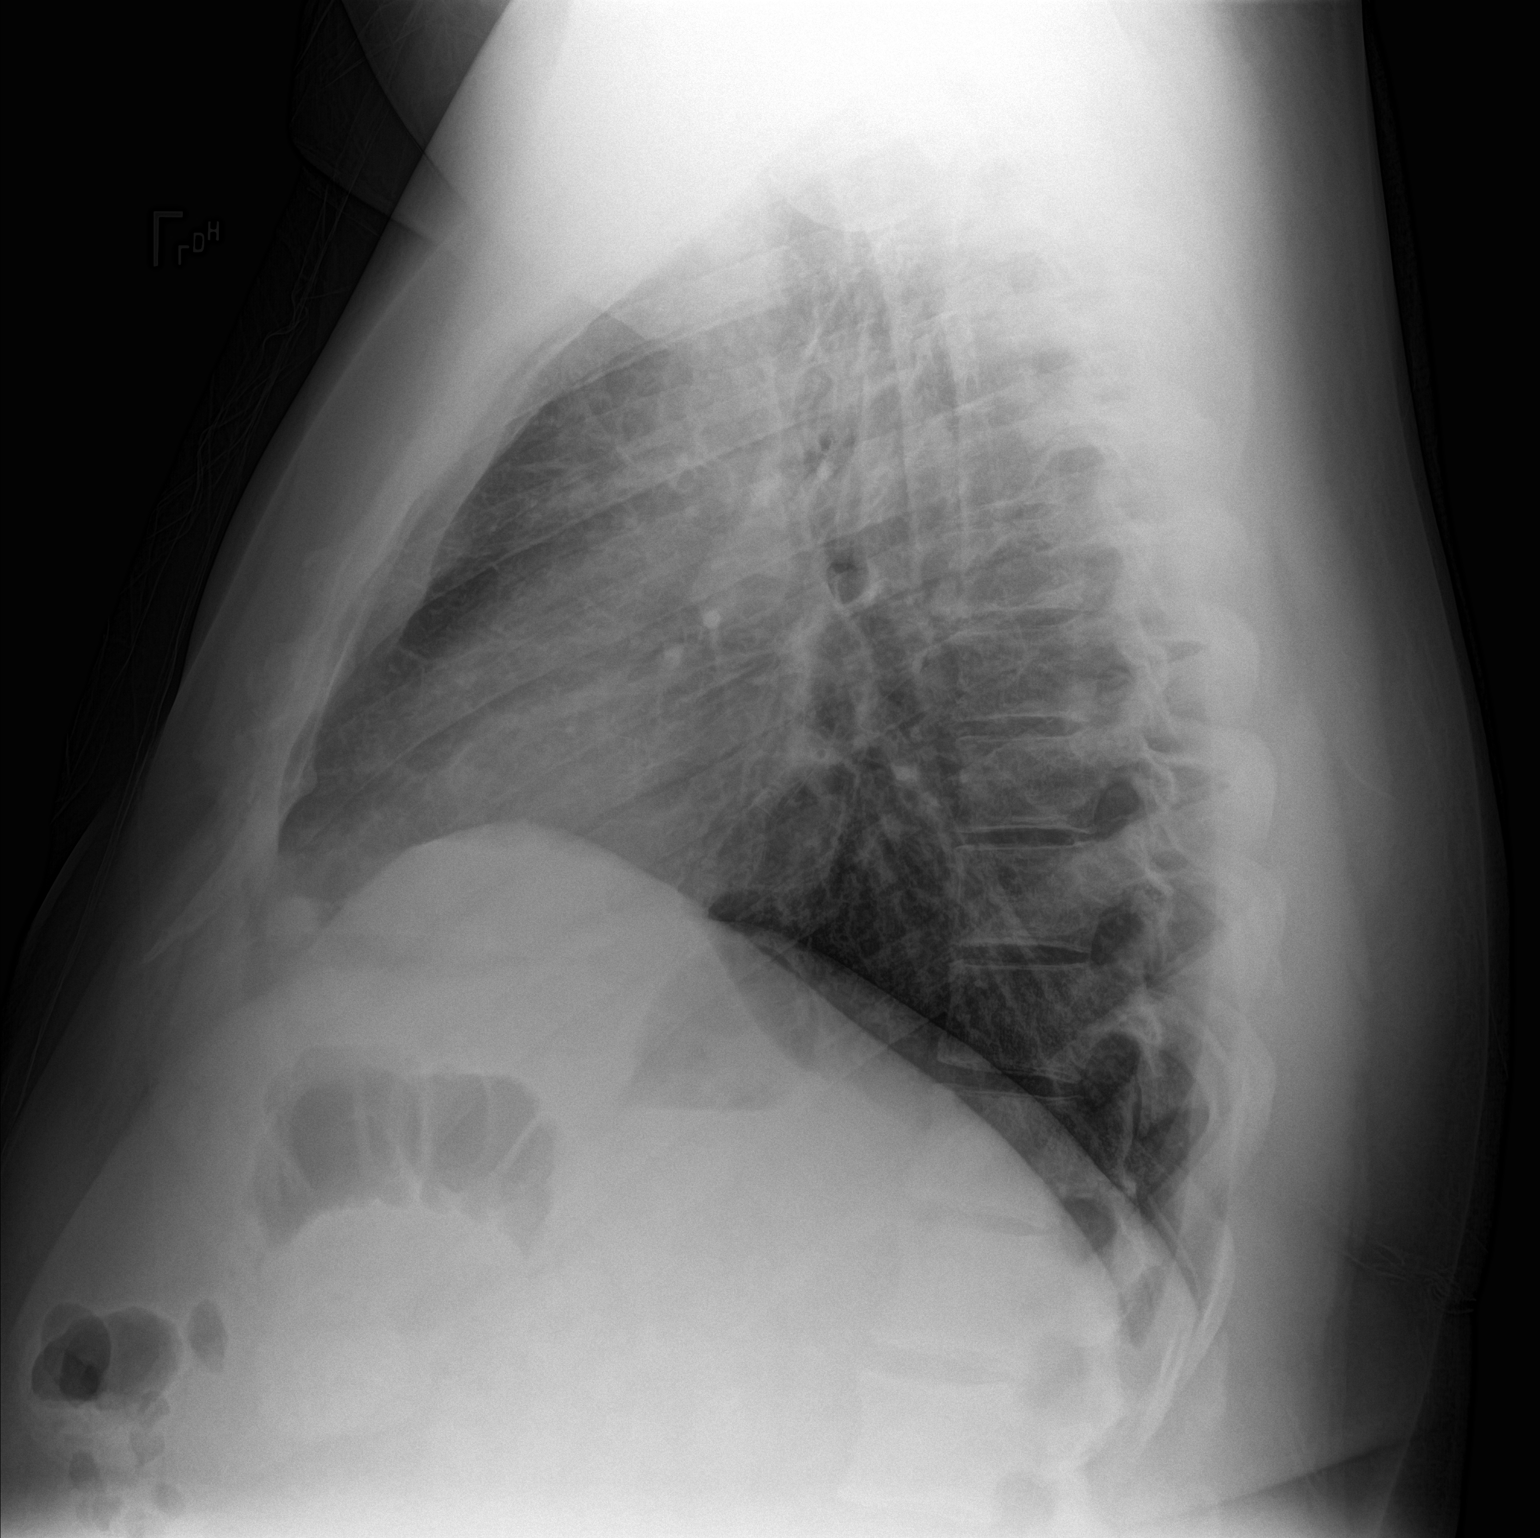

[2 of 2 positions shown; findings below may reference images not displayed]

FINDINGS: The heart size and vascular pattern are normal. Stable mildly
increased bronchovascular markings on the right and mild
peribronchial wall thickening bilaterally. Mildly increased markings
in the left lower lobe when compared to the prior study. No pleural
effusion.
IMPRESSION: Continued bronchitic change. Mildly increased markings focally in
the left lower lobe when compared to recent prior study. Subtle
infiltrate related to pneumonia not excluded.

## 2016-12-31 ENCOUNTER — Telehealth: Payer: Self-pay | Admitting: Internal Medicine

## 2016-12-31 DIAGNOSIS — R739 Hyperglycemia, unspecified: Secondary | ICD-10-CM

## 2016-12-31 DIAGNOSIS — R945 Abnormal results of liver function studies: Secondary | ICD-10-CM

## 2016-12-31 DIAGNOSIS — E7800 Pure hypercholesterolemia, unspecified: Secondary | ICD-10-CM

## 2016-12-31 DIAGNOSIS — Z125 Encounter for screening for malignant neoplasm of prostate: Secondary | ICD-10-CM

## 2016-12-31 DIAGNOSIS — R7989 Other specified abnormal findings of blood chemistry: Secondary | ICD-10-CM

## 2016-12-31 NOTE — Telephone Encounter (Signed)
Pt spouse called and stated that pt would like to have labs done before his appt on 10/1. Please advise, thank you!

## 2016-12-31 NOTE — Telephone Encounter (Signed)
I have made lab app for patient on 9/27. He has follow up with you on 10/1. Let me know if that is ok and I will call and give him time and date of app.

## 2017-01-01 NOTE — Telephone Encounter (Signed)
Called l/m message with date and time of app. Pt also has mychart so it will alert him.

## 2017-01-01 NOTE — Telephone Encounter (Signed)
Lab orders placed.  Ok to schedule fasting lab appt.

## 2017-03-11 ENCOUNTER — Other Ambulatory Visit (INDEPENDENT_AMBULATORY_CARE_PROVIDER_SITE_OTHER): Payer: BLUE CROSS/BLUE SHIELD

## 2017-03-11 DIAGNOSIS — R945 Abnormal results of liver function studies: Secondary | ICD-10-CM

## 2017-03-11 DIAGNOSIS — E7800 Pure hypercholesterolemia, unspecified: Secondary | ICD-10-CM

## 2017-03-11 DIAGNOSIS — R739 Hyperglycemia, unspecified: Secondary | ICD-10-CM | POA: Diagnosis not present

## 2017-03-11 DIAGNOSIS — R7989 Other specified abnormal findings of blood chemistry: Secondary | ICD-10-CM

## 2017-03-11 DIAGNOSIS — Z125 Encounter for screening for malignant neoplasm of prostate: Secondary | ICD-10-CM | POA: Diagnosis not present

## 2017-03-11 LAB — HEPATIC FUNCTION PANEL
ALBUMIN: 4.2 g/dL (ref 3.5–5.2)
ALT: 24 U/L (ref 0–53)
AST: 20 U/L (ref 0–37)
Alkaline Phosphatase: 51 U/L (ref 39–117)
BILIRUBIN DIRECT: 0.1 mg/dL (ref 0.0–0.3)
TOTAL PROTEIN: 6.7 g/dL (ref 6.0–8.3)
Total Bilirubin: 0.4 mg/dL (ref 0.2–1.2)

## 2017-03-11 LAB — LIPID PANEL
CHOL/HDL RATIO: 8
CHOLESTEROL: 262 mg/dL — AB (ref 0–200)
HDL: 33.4 mg/dL — AB (ref >39.00)
LDL CALC: 206 mg/dL — AB (ref 0–99)
NonHDL: 228.93
TRIGLYCERIDES: 116 mg/dL (ref 0.0–149.0)
VLDL: 23.2 mg/dL (ref 0.0–40.0)

## 2017-03-11 LAB — CBC WITH DIFFERENTIAL/PLATELET
BASOS ABS: 0 10*3/uL (ref 0.0–0.1)
Basophils Relative: 0.3 % (ref 0.0–3.0)
EOS PCT: 1.7 % (ref 0.0–5.0)
Eosinophils Absolute: 0.1 10*3/uL (ref 0.0–0.7)
HEMATOCRIT: 43.3 % (ref 39.0–52.0)
HEMOGLOBIN: 14.5 g/dL (ref 13.0–17.0)
LYMPHS PCT: 32.3 % (ref 12.0–46.0)
Lymphs Abs: 2 10*3/uL (ref 0.7–4.0)
MCHC: 33.4 g/dL (ref 30.0–36.0)
MCV: 89.7 fl (ref 78.0–100.0)
MONOS PCT: 7.7 % (ref 3.0–12.0)
Monocytes Absolute: 0.5 10*3/uL (ref 0.1–1.0)
Neutro Abs: 3.6 10*3/uL (ref 1.4–7.7)
Neutrophils Relative %: 58 % (ref 43.0–77.0)
Platelets: 224 10*3/uL (ref 150.0–400.0)
RBC: 4.83 Mil/uL (ref 4.22–5.81)
RDW: 13.5 % (ref 11.5–15.5)
WBC: 6.2 10*3/uL (ref 4.0–10.5)

## 2017-03-11 LAB — BASIC METABOLIC PANEL
BUN: 22 mg/dL (ref 6–23)
CALCIUM: 9.2 mg/dL (ref 8.4–10.5)
CO2: 27 mEq/L (ref 19–32)
Chloride: 105 mEq/L (ref 96–112)
Creatinine, Ser: 0.73 mg/dL (ref 0.40–1.50)
GFR: 124.38 mL/min (ref >60.00)
GLUCOSE: 115 mg/dL — AB (ref 70–99)
Potassium: 4.2 mEq/L (ref 3.5–5.1)
SODIUM: 139 meq/L (ref 135–145)

## 2017-03-11 LAB — HEMOGLOBIN A1C: HEMOGLOBIN A1C: 5.9 % (ref 4.6–6.5)

## 2017-03-11 LAB — TSH: TSH: 0.71 u[IU]/mL (ref 0.35–4.50)

## 2017-03-11 LAB — PSA: PSA: 0.73 ng/mL (ref 0.10–4.00)

## 2017-03-15 ENCOUNTER — Ambulatory Visit (INDEPENDENT_AMBULATORY_CARE_PROVIDER_SITE_OTHER): Payer: BLUE CROSS/BLUE SHIELD | Admitting: Internal Medicine

## 2017-03-15 ENCOUNTER — Encounter: Payer: Self-pay | Admitting: Internal Medicine

## 2017-03-15 ENCOUNTER — Ambulatory Visit (INDEPENDENT_AMBULATORY_CARE_PROVIDER_SITE_OTHER): Payer: BLUE CROSS/BLUE SHIELD

## 2017-03-15 VITALS — BP 116/74 | HR 78 | Temp 98.6°F | Resp 12 | Ht 71.0 in | Wt 220.6 lb

## 2017-03-15 DIAGNOSIS — K219 Gastro-esophageal reflux disease without esophagitis: Secondary | ICD-10-CM | POA: Diagnosis not present

## 2017-03-15 DIAGNOSIS — R059 Cough, unspecified: Secondary | ICD-10-CM

## 2017-03-15 DIAGNOSIS — R739 Hyperglycemia, unspecified: Secondary | ICD-10-CM

## 2017-03-15 DIAGNOSIS — Z720 Tobacco use: Secondary | ICD-10-CM | POA: Diagnosis not present

## 2017-03-15 DIAGNOSIS — R05 Cough: Secondary | ICD-10-CM

## 2017-03-15 DIAGNOSIS — R945 Abnormal results of liver function studies: Secondary | ICD-10-CM | POA: Diagnosis not present

## 2017-03-15 DIAGNOSIS — G4733 Obstructive sleep apnea (adult) (pediatric): Secondary | ICD-10-CM

## 2017-03-15 DIAGNOSIS — R7989 Other specified abnormal findings of blood chemistry: Secondary | ICD-10-CM

## 2017-03-15 DIAGNOSIS — Z0000 Encounter for general adult medical examination without abnormal findings: Secondary | ICD-10-CM

## 2017-03-15 DIAGNOSIS — R9389 Abnormal findings on diagnostic imaging of other specified body structures: Secondary | ICD-10-CM

## 2017-03-15 DIAGNOSIS — E7800 Pure hypercholesterolemia, unspecified: Secondary | ICD-10-CM

## 2017-03-15 NOTE — Progress Notes (Addendum)
Patient ID: Isaac Sims, male   DOB: Jan 26, 1976, 41 y.o.   MRN: [id number]   Subjective:    Patient ID: Isaac Sims, male    DOB: 1975-07-03, 41 y.o.   MRN: [id number]  HPI  Patient here for his physical exam.  He is doing well.  Lost weight. Feels better.  Has cut down carbs.  Does not exercise regularly.  Plans to start.  No chest pain.  No sob.  No acid reflux.  No abdominal pain.  Bowels moving.  Two weeks ago, started having some chest congestion.  Prior to this, no congestion or cough.  Some sinus issues recently.  Symptoms improved.  Has been taking nyquil and tussin.  No acid reflux. No abdominal pain.  Since adjusting diet and losing weight, has not had GI issues.  Bowels stable.  Overall feels good.  Discussed labs.    Past Medical History:  Diagnosis Date  . Duodenitis   . Esophagitis   . GERD (gastroesophageal reflux disease)   . Hiatal hernia   . Hypercholesterolemia   . Nephrolithiasis    Past Surgical History:  Procedure Laterality Date  . LITHOTRIPSY  2011   Family History  Problem Relation Age of Onset  . Alzheimer's disease Maternal Grandfather   . Diabetes Unknown        parent  . Colon cancer Neg Hx    Social History   Social History  . Marital status: Married    Spouse name: N/A  . Number of children: 3  . Years of education: N/A   Social History Main Topics  . Smoking status: Current Every Day Smoker    Last attempt to quit: 07/16/2008  . Smokeless tobacco: Current User    Types: Snuff     Comment: stopped in 04/15/14  . Alcohol use 0.0 oz/week     Comment: occasional  . Drug use: No  . Sexual activity: Not Asked   Other Topics Concern  . None   Social History Narrative  . None    Outpatient Encounter Prescriptions as of 03/15/2017  Medication Sig  . [DISCONTINUED] cetirizine (ZYRTEC) 10 MG tablet Take 10 mg by mouth daily as needed.  . [DISCONTINUED] calcium carbonate (TUMS - DOSED IN MG ELEMENTAL CALCIUM) 500 MG chewable  tablet Chew 1 tablet by mouth daily as needed.   . [DISCONTINUED] esomeprazole (NEXIUM) 40 MG capsule Take 1 capsule (40 mg total) by mouth daily.  . [DISCONTINUED] omeprazole (PRILOSEC) 20 MG capsule Take 20 mg by mouth daily as needed.    No facility-administered encounter medications on file as of 03/15/2017.     Review of Systems  Constitutional: Negative for appetite change and unexpected weight change.  HENT: Positive for congestion. Negative for sinus pressure.   Eyes: Negative for pain and visual disturbance.  Respiratory: Positive for cough. Negative for chest tightness and shortness of breath.   Cardiovascular: Negative for chest pain, palpitations and leg swelling.  Gastrointestinal: Negative for abdominal pain, diarrhea, nausea and vomiting.  Genitourinary: Negative for difficulty urinating and dysuria.  Musculoskeletal: Negative for back pain and joint swelling.  Skin: Negative for color change and rash.  Neurological: Negative for dizziness, light-headedness and headaches.  Hematological: Negative for adenopathy. Does not bruise/bleed easily.  Psychiatric/Behavioral: Negative for agitation and dysphoric mood.       Objective:    Physical Exam  Constitutional: He is oriented to person, place, and time. He appears well-developed and well-nourished. No distress.  HENT:  Head: Normocephalic and atraumatic.  Nose: Nose normal.  Mouth/Throat: Oropharynx is clear and moist. No oropharyngeal exudate.  Eyes: Conjunctivae are normal. Right eye exhibits no discharge. Left eye exhibits no discharge.  Neck: Neck supple. No thyromegaly present.  Cardiovascular: Normal rate and regular rhythm.   Pulmonary/Chest: Breath sounds normal. No respiratory distress. He has no wheezes.  Abdominal: Soft. Bowel sounds are normal. There is no tenderness.  Genitourinary:  Genitourinary Comments: Pt declined.   Musculoskeletal: He exhibits no edema or tenderness.  Lymphadenopathy:    He has  no cervical adenopathy.  Neurological: He is alert and oriented to person, place, and time.  Skin: Skin is warm and dry. No rash noted. No erythema.  Psychiatric: He has a normal mood and affect. His behavior is normal.    BP 116/74 (BP Location: Left Arm, Patient Position: Sitting, Cuff Size: Normal)   Pulse 78   Temp 98.6 F (37 C) (Oral)   Resp 12   Ht '5\' 11"'$  (1.803 m)   Wt 220 lb 9.6 oz (100.1 kg)   SpO2 96%   BMI 30.77 kg/m  Wt Readings from Last 3 Encounters:  03/15/17 220 lb 9.6 oz (100.1 kg)  04/08/15 272 lb (123.4 kg)  03/11/15 284 lb 12 oz (129.2 kg)     Lab Results  Component Value Date   WBC 6.2 03/11/2017   HGB 14.5 03/11/2017   HCT 43.3 03/11/2017   PLT 224.0 03/11/2017   GLUCOSE 115 (H) 03/11/2017   CHOL 262 (H) 03/11/2017   TRIG 116.0 03/11/2017   HDL 33.40 (L) 03/11/2017   LDLDIRECT 149.0 02/08/2015   LDLCALC 206 (H) 03/11/2017   ALT 24 03/11/2017   AST 20 03/11/2017   NA 139 03/11/2017   K 4.2 03/11/2017   CL 105 03/11/2017   CREATININE 0.73 03/11/2017   BUN 22 03/11/2017   CO2 27 03/11/2017   TSH 0.71 03/11/2017   PSA 0.73 03/11/2017   INR 0.9 09/04/2008   HGBA1C 5.9 03/11/2017    Dg Chest 2 View  Result Date: 03/12/2015 CLINICAL DATA:  Cough congestion for 1.5 weeks, has been taking antibiotics: Patient smokes EXAM: CHEST  2 VIEW COMPARISON:  02/12/2015 FINDINGS: The heart size and vascular pattern are normal. Stable mildly increased bronchovascular markings on the right and mild peribronchial wall thickening bilaterally. Mildly increased markings in the left lower lobe when compared to the prior study. No pleural effusion. IMPRESSION: Continued bronchitic change. Mildly increased markings focally in the left lower lobe when compared to recent prior study. Subtle infiltrate related to pneumonia not excluded. Electronically Signed   By: Skipper Cliche M.D.   On: 03/12/2015 08:27   Dg Cervical Spine 2 Or 3 Views  Result Date:  03/12/2015 CLINICAL DATA:  Six months of neck and left shoulder pain intermittently radiating to the left elbow. No reported injury. EXAM: CERVICAL SPINE  4+ VIEWS COMPARISON:  11/07/2008 neck CT. FINDINGS: There is straightening of the cervical spine. The cervical spine is visualized to the level of the lower C7 endplate on the lateral view. The prevertebral soft tissues are within normal limits. Cervical vertebral body heights are preserved, with no fracture, subluxation or suspicious focal osseous lesion. There is mild loss of disc height at C5-6. There are minimal spondylotic changes throughout the cervical disc spaces. The dens is well positioned between the lateral masses of C1. There is mild facet arthropathy in lower cervical spine, asymmetric to the left. IMPRESSION: 1. Straightening of the cervical spine,  usually due to the positioning and/or muscle spasm. 2. Mild multilevel degenerative disc disease in the cervical spine, most prominent at C5-6. 3. Mild facet arthropathy in the lower cervical spine, asymmetric to the left. Electronically Signed   By: Ilona Sorrel M.D.   On: 03/12/2015 08:29       Assessment & Plan:   Problem List Items Addressed This Visit    Abnormal CXR    Recheck cxr today.       Relevant Orders   DG Chest 2 View (Completed)   Abnormal liver function test    Recent liver panel wnl.  Has adjusted diet.  Lost weight.  Follow.        Cough    Recent cough and congestion improved.  Breathing has been doing well.  Robitussin DM and nasacort as directed.  Recheck cxr - to compare to previous.       Relevant Orders   DG Chest 2 View (Completed)   GERD (gastroesophageal reflux disease)    No acid reflux now.  No abdominal pain.  Has done well since adjusting diet and lost weight.  Follow.        Healthcare maintenance    Physical today 03/15/17.  PSA 03/11/16 - .78       Hypercholesterolemia    Has adjusted diet and lost weight.  Discussed recent cholesterol  results.  Persistent elevation in LDL.  Discussed my desire to start cholesterol medication.  He declines.  Low cholesterol diet and exercise.  Follow lipid panel.        Relevant Orders   Hepatic function panel   Lipid panel   Hyperglycemia    Low carb diet and exercise.  Follow met b and a1c.        Relevant Orders   Hemoglobin R9F   Basic metabolic panel   Obstructive sleep apnea    Not able to tolerate the mask.  Has tried.  Since losing weight, states he is sleeping well and his wife reports - better.        Tobacco use    Have discussed the need to quit.        Other Visit Diagnoses    Routine general medical examination at a health care facility    -  Primary       Einar Pheasant, MD

## 2017-03-15 NOTE — Patient Instructions (Signed)
Saline nasal spray - flush nose at least 2-3x/day  nasacort nasal spray - 2 sprays each nostril one time per day.  Do this in the evening.    Robitussin DM twice a day as needed.  

## 2017-03-17 ENCOUNTER — Encounter: Payer: Self-pay | Admitting: Internal Medicine

## 2017-03-18 ENCOUNTER — Encounter: Payer: Self-pay | Admitting: Internal Medicine

## 2017-03-18 DIAGNOSIS — Z0000 Encounter for general adult medical examination without abnormal findings: Secondary | ICD-10-CM | POA: Insufficient documentation

## 2017-03-18 NOTE — Assessment & Plan Note (Signed)
Recheck cxr today.

## 2017-03-18 NOTE — Assessment & Plan Note (Addendum)
Has adjusted diet and lost weight.  Discussed recent cholesterol results.  Persistent elevation in LDL.  Discussed my desire to start cholesterol medication.  He declines.  Low cholesterol diet and exercise.  Follow lipid panel.

## 2017-03-18 NOTE — Assessment & Plan Note (Signed)
Recent liver panel wnl.  Has adjusted diet.  Lost weight.  Follow.

## 2017-03-18 NOTE — Assessment & Plan Note (Signed)
Physical today 03/15/17.  PSA 03/11/16 - .78

## 2017-03-18 NOTE — Assessment & Plan Note (Signed)
Not able to tolerate the mask.  Has tried.  Since losing weight, states he is sleeping well and his wife reports - better.

## 2017-03-18 NOTE — Assessment & Plan Note (Signed)
Recent cough and congestion improved.  Breathing has been doing well.  Robitussin DM and nasacort as directed.  Recheck cxr - to compare to previous.

## 2017-03-18 NOTE — Assessment & Plan Note (Signed)
Low carb diet and exercise.  Follow met b and a1c.   

## 2017-03-18 NOTE — Assessment & Plan Note (Signed)
Have discussed the need to quit.   

## 2017-03-18 NOTE — Assessment & Plan Note (Signed)
No acid reflux now.  No abdominal pain.  Has done well since adjusting diet and lost weight.  Follow.

## 2017-05-10 ENCOUNTER — Ambulatory Visit: Payer: BLUE CROSS/BLUE SHIELD | Admitting: Family Medicine

## 2017-05-10 ENCOUNTER — Encounter: Payer: Self-pay | Admitting: Family Medicine

## 2017-05-10 VITALS — BP 102/70 | HR 70 | Temp 98.1°F | Wt 227.2 lb

## 2017-05-10 DIAGNOSIS — K219 Gastro-esophageal reflux disease without esophagitis: Secondary | ICD-10-CM

## 2017-05-10 MED ORDER — PANTOPRAZOLE SODIUM 20 MG PO TBEC: 20.0000 mg | DELAYED_RELEASE_TABLET | Freq: Every day | ORAL | 1 refills | Status: DC

## 2017-05-10 NOTE — Progress Notes (Signed)
Subjective:    Patient ID: Isaac Sims, male    DOB: 27-Jul-1975, 41 y.o.   MRN: [id number]  HPI  Isaac Sims is a 41 year old male who presents today with a dull ache in the epigastric area that has been present for one week intermittently. Pain is rated as a 4 or 5 and noted as dull.  Isaac Sims reports a history of hiatal hernia that has been evaluated previously and no surgery was recommended per patient.  Isaac Sims was advised to take an acid reducer daily however Isaac Sims has not been doing this daily. Isaac Sims cannot remember the last time Isaac Sims has taken an acid reducer. Isaac Sims states that this pain is the same as Isaac Sims has experienced previously with esophageal spasms and reflux. No other treatments tried. Trigger for acid reflux include NSAIDs and smoking and large meals at night. Isaac Sims smokes 1 ppd. Isaac Sims states that this pain has improved today.  Isaac Sims denies difficulty swallowing with food or drink. Isaac Sims reports that Isaac Sims can "feel it" but it does not choke him and Isaac Sims can swallow. Isaac Sims denies melena, hematochezia, chest pain, jaw pain, N/V/D, arm pain, numbness, tingling, weakness, palpitations, or SOB. CXR on 03/15/17 was unremarkable Allergies noted on chart of ASA and ibuprofen are not true allergies per patient but rather triggers for reflux.  Recent intentional weight loss with improved dietary choices and increased water intake.  Wt Readings from Last 3 Encounters:  05/10/17 227 lb 3.2 oz (103.1 kg)  03/15/17 220 lb 9.6 oz (100.1 kg)  04/08/15 272 lb (123.4 kg)   Location: epigastic Quality: dull  Duration: intermittently for one week  Onset (rest, exertion): No relationship with rest/exertion Radiation: none  Better with: avoidance of triggers Worse with: triggers NSAIDs, smoking, large meals  Symptoms History of Trauma/lifting: No Nausea/vomiting: No  Diaphoresis: No  Shortness of breath: No  Pleuritic: No  Cough: No  Edema: No  Orthopnea: No  PND: No  Dizziness:No Palpitations: No  Syncope: No    Indigestion: No   Red Flags Worse with exertion: No  Recent Immobility: No  Cancer history: No  Tearing/radiation to back: No   Review of Systems  Constitutional: Negative for chills, fatigue and fever.  Respiratory: Negative for cough, shortness of breath and wheezing.   Cardiovascular: Negative for chest pain, palpitations and leg swelling.  Gastrointestinal: Negative for abdominal pain, constipation, diarrhea, nausea and vomiting.       Stomach pain  Musculoskeletal: Negative for myalgias.  Skin: Negative for rash.  Neurological: Negative for dizziness, weakness, light-headedness, numbness and headaches.  Psychiatric/Behavioral:       Denies depressed or anxious mood   Past Medical History:  Diagnosis Date  . Duodenitis   . Esophagitis   . GERD (gastroesophageal reflux disease)   . Hiatal hernia   . Hypercholesterolemia   . Nephrolithiasis      Social History   Socioeconomic History  . Marital status: Married    Spouse name: Not on file  . Number of children: 3  . Years of education: Not on file  . Highest education level: Not on file  Social Needs  . Financial resource strain: Not on file  . Food insecurity - worry: Not on file  . Food insecurity - inability: Not on file  . Transportation needs - medical: Not on file  . Transportation needs - non-medical: Not on file  Occupational History  . Not on file  Tobacco Use  . Smoking status: Current  Every Day Smoker    Last attempt to quit: 07/16/2008    Years since quitting: 8.8  . Smokeless tobacco: Current User    Types: Snuff  . Tobacco comment: stopped in 04/15/14  Substance and Sexual Activity  . Alcohol use: Yes    Alcohol/week: 0.0 oz    Comment: occasional  . Drug use: No  . Sexual activity: Not on file  Other Topics Concern  . Not on file  Social History Narrative  . Not on file    Past Surgical History:  Procedure Laterality Date  . LITHOTRIPSY  2011    Family History  Problem Relation  Age of Onset  . Alzheimer's disease Maternal Grandfather   . Diabetes Unknown        parent  . Colon cancer Neg Hx     Allergies  Allergen Reactions  . Aspirin   . Ibuprofen   . Tylenol [Acetaminophen]     No current outpatient medications on file prior to visit.   No current facility-administered medications on file prior to visit.     BP 102/70   Pulse 70   Temp 98.1 F (36.7 C) (Oral)   Wt 227 lb 3.2 oz (103.1 kg)   SpO2 96%   BMI 31.69 kg/m       Objective:   Physical Exam  Constitutional: Isaac Sims is oriented to person, place, and time. Isaac Sims appears well-developed and well-nourished.  HENT:  Right Ear: Tympanic membrane normal.  Left Ear: Tympanic membrane normal.  Nose: No rhinorrhea. Right sinus exhibits no maxillary sinus tenderness and no frontal sinus tenderness. Left sinus exhibits no maxillary sinus tenderness and no frontal sinus tenderness.  Mouth/Throat: Oropharynx is clear and moist.  Eyes: Pupils are equal, round, and reactive to light. No scleral icterus.  Neck: Neck supple.  Cardiovascular: Normal rate, regular rhythm and intact distal pulses.  Pulmonary/Chest: Effort normal and breath sounds normal. Isaac Sims has no wheezes. Isaac Sims has no rales.  Abdominal: Soft. Bowel sounds are normal. There is no tenderness. There is no rebound.  Musculoskeletal: Isaac Sims exhibits no edema.  Lymphadenopathy:    Isaac Sims has no cervical adenopathy.  Neurological: Isaac Sims is alert and oriented to person, place, and time. Coordination normal.  Skin: Skin is warm and dry. No rash noted.  Psychiatric: Isaac Sims has a normal mood and affect. His behavior is normal. Judgment and thought content normal.       Assessment & Plan:  1. Gastroesophageal reflux disease, esophagitis presence not specified Exam is reassuring; suspect that triggers for reflux have caused symptom of epigastric discomfort. EKG politely declined by patient stating Isaac Sims felt this was no different than his previous GI discomfort. History  of esophagitis/duodenitis/hiatal hernia per EGD 2000 noted in visit on 08/31/11. EGD unavailable at this time for review. Advised avoidance of triggers and initiated acid reducer with a referral to GI due to history and no recent follow up to GI by patient. Advised tobacco cessation; Isaac Sims is not interested in quitting at this time.  Return precautions discussed and advised follow up with PCP if symptoms do not improve. Isaac Sims verbalized understanding and agreed with plan.  - Ambulatory referral to Gastroenterology - pantoprazole (PROTONIX) 20 MG tablet; Take 1 tablet (20 mg total) by mouth daily.  Dispense: 30 tablet; Refill: 1  Roddie McJulia Adryan Shin, FNP-C

## 2017-05-10 NOTE — Patient Instructions (Signed)
Please take medication as directed and avoid triggers for reflux as we discussed. You will be contact about your referral.  Please let us know if you have not heard back within 1 week about your referral.  If you notice symptoms that are not improving, worsening, or new symptoms develop, please see medical attention.   Food Choices for Gastroesophageal Reflux Disease, Adult When you have gastroesophageal reflux disease (GERD), the foods you eat and your eating habits are very important. Choosing the right foods can help ease your discomfort. What guidelines do I need to follow?  Choose fruits, vegetables, whole grains, and low-fat dairy products.  Choose low-fat meat, fish, and poultry.  Limit fats such as oils, salad dressings, butter, nuts, and avocado.  Keep a food diary. This helps you identify foods that cause symptoms.  Avoid foods that cause symptoms. These may be different for everyone.  Eat small meals often instead of 3 large meals a day.  Eat your meals slowly, in a place where you are relaxed.  Limit fried foods.  Cook foods using methods other than frying.  Avoid drinking alcohol.  Avoid drinking large amounts of liquids with your meals.  Avoid bending over or lying down until 2-3 hours after eating. What foods are not recommended? These are some foods and drinks that may make your symptoms worse: Vegetables Tomatoes. Tomato juice. Tomato and spaghetti sauce. Chili peppers. Onion and garlic. Horseradish. Fruits Oranges, grapefruit, and lemon (fruit and juice). Meats High-fat meats, fish, and poultry. This includes hot dogs, ribs, ham, sausage, salami, and bacon. Dairy Whole milk and chocolate milk. Sour cream. Cream. Butter. Ice cream. Cream cheese. Drinks Coffee and tea. Bubbly (carbonated) drinks or energy drinks. Condiments Hot sauce. Barbecue sauce. Sweets/Desserts Chocolate and cocoa. Donuts. Peppermint and spearmint. Fats and Oils High-fat foods.  This includes [place] fries and potato chips. Other Vinegar. Strong spices. This includes black pepper, white pepper, red pepper, cayenne, curry powder, cloves, ginger, and chili powder. The items listed above may not be a complete list of foods and drinks to avoid. Contact your dietitian for more information. This information is not intended to replace advice given to you by your health care provider. Make sure you discuss any questions you have with your health care provider. Document Released: 12/01/2011 Document Revised: 11/07/2015 Document Reviewed: 04/05/2013 Elsevier Interactive Patient Education  2017 [employer]

## 2017-05-12 ENCOUNTER — Other Ambulatory Visit: Payer: Self-pay | Admitting: Family Medicine

## 2017-05-12 DIAGNOSIS — K219 Gastro-esophageal reflux disease without esophagitis: Secondary | ICD-10-CM

## 2017-08-31 ENCOUNTER — Encounter: Payer: Self-pay | Admitting: Internal Medicine

## 2017-09-13 ENCOUNTER — Other Ambulatory Visit: Payer: BLUE CROSS/BLUE SHIELD

## 2017-09-17 ENCOUNTER — Ambulatory Visit: Payer: BLUE CROSS/BLUE SHIELD | Admitting: Internal Medicine

## 2018-10-14 ENCOUNTER — Telehealth: Payer: Self-pay

## 2018-10-14 DIAGNOSIS — R739 Hyperglycemia, unspecified: Secondary | ICD-10-CM

## 2018-10-14 DIAGNOSIS — R7989 Other specified abnormal findings of blood chemistry: Secondary | ICD-10-CM

## 2018-10-14 DIAGNOSIS — Z125 Encounter for screening for malignant neoplasm of prostate: Secondary | ICD-10-CM

## 2018-10-14 DIAGNOSIS — R945 Abnormal results of liver function studies: Secondary | ICD-10-CM

## 2018-10-14 DIAGNOSIS — Z0000 Encounter for general adult medical examination without abnormal findings: Secondary | ICD-10-CM

## 2018-10-14 DIAGNOSIS — E7800 Pure hypercholesterolemia, unspecified: Secondary | ICD-10-CM

## 2018-10-14 NOTE — Telephone Encounter (Signed)
Orders placed for labs

## 2018-10-14 NOTE — Telephone Encounter (Signed)
Pt is scheduled for labs on 02/21/2019 before his physical on 02/23/2019. I have ordered CBC, CMP, A1c, Lipid, PSA, and TSH. Is there anything else that needs to be ordered?

## 2019-02-21 ENCOUNTER — Other Ambulatory Visit: Payer: BLUE CROSS/BLUE SHIELD

## 2019-02-23 ENCOUNTER — Encounter: Payer: BLUE CROSS/BLUE SHIELD | Admitting: Internal Medicine

## 2019-07-10 ENCOUNTER — Ambulatory Visit (INDEPENDENT_AMBULATORY_CARE_PROVIDER_SITE_OTHER): Payer: BC Managed Care – PPO | Admitting: Internal Medicine

## 2019-07-10 ENCOUNTER — Other Ambulatory Visit: Payer: Self-pay

## 2019-07-10 ENCOUNTER — Emergency Department: Payer: BC Managed Care – PPO

## 2019-07-10 ENCOUNTER — Emergency Department
Admission: EM | Admit: 2019-07-10 | Discharge: 2019-07-10 | Disposition: A | Discharge status: Home / Self Care | Payer: BC Managed Care – PPO | Attending: Emergency Medicine | Admitting: Emergency Medicine

## 2019-07-10 ENCOUNTER — Encounter: Payer: Self-pay | Admitting: Internal Medicine

## 2019-07-10 DIAGNOSIS — R109 Unspecified abdominal pain: Secondary | ICD-10-CM | POA: Diagnosis not present

## 2019-07-10 DIAGNOSIS — R1030 Lower abdominal pain, unspecified: Secondary | ICD-10-CM | POA: Diagnosis present

## 2019-07-10 DIAGNOSIS — K625 Hemorrhage of anus and rectum: Secondary | ICD-10-CM | POA: Diagnosis not present

## 2019-07-10 DIAGNOSIS — Z87442 Personal history of urinary calculi: Secondary | ICD-10-CM | POA: Diagnosis not present

## 2019-07-10 DIAGNOSIS — F17200 Nicotine dependence, unspecified, uncomplicated: Secondary | ICD-10-CM | POA: Insufficient documentation

## 2019-07-10 DIAGNOSIS — K4090 Unilateral inguinal hernia, without obstruction or gangrene, not specified as recurrent: Secondary | ICD-10-CM | POA: Insufficient documentation

## 2019-07-10 DIAGNOSIS — R0602 Shortness of breath: Secondary | ICD-10-CM | POA: Diagnosis not present

## 2019-07-10 DIAGNOSIS — K219 Gastro-esophageal reflux disease without esophagitis: Secondary | ICD-10-CM

## 2019-07-10 LAB — COMPREHENSIVE METABOLIC PANEL
ALT: 28 U/L (ref 0–44)
AST: 25 U/L (ref 15–41)
Albumin: 3.7 g/dL (ref 3.5–5.0)
Alkaline Phosphatase: 47 U/L (ref 38–126)
Anion gap: 6 (ref 5–15)
BUN: 13 mg/dL (ref 6–20)
CO2: 28 mmol/L (ref 22–32)
Calcium: 9.1 mg/dL (ref 8.9–10.3)
Chloride: 106 mmol/L (ref 98–111)
Creatinine, Ser: 1.14 mg/dL (ref 0.61–1.24)
GFR calc Af Amer: >60 mL/min (ref >60)
GFR calc non Af Amer: >60 mL/min (ref >60)
Glucose, Bld: 106 mg/dL — ABNORMAL HIGH (ref 70–99)
Potassium: 4.2 mmol/L (ref 3.5–5.1)
Sodium: 140 mmol/L (ref 135–145)
Total Bilirubin: 0.4 mg/dL (ref 0.3–1.2)
Total Protein: 7.2 g/dL (ref 6.5–8.1)

## 2019-07-10 LAB — URINALYSIS, COMPLETE (UACMP) WITH MICROSCOPIC
Bacteria, UA: NONE SEEN
Bilirubin Urine: NEGATIVE
Glucose, UA: NEGATIVE mg/dL
Hgb urine dipstick: NEGATIVE
Ketones, ur: NEGATIVE mg/dL
Leukocytes,Ua: NEGATIVE
Nitrite: NEGATIVE
Protein, ur: NEGATIVE mg/dL
Specific Gravity, Urine: 1.003 — ABNORMAL LOW (ref 1.005–1.030)
Squamous Epithelial / HPF: NONE SEEN (ref 0–5)
pH: 7 (ref 5.0–8.0)

## 2019-07-10 LAB — CBC
HCT: 39.8 % (ref 39.0–52.0)
Hemoglobin: 13.5 g/dL (ref 13.0–17.0)
MCH: 29.5 pg (ref 26.0–34.0)
MCHC: 33.9 g/dL (ref 30.0–36.0)
MCV: 87.1 fL (ref 80.0–100.0)
Platelets: 301 10*3/uL (ref 150–400)
RBC: 4.57 MIL/uL (ref 4.22–5.81)
RDW: 12.6 % (ref 11.5–15.5)
WBC: 7.8 10*3/uL (ref 4.0–10.5)
nRBC: 0 % (ref 0.0–0.2)

## 2019-07-10 LAB — LIPASE, BLOOD: Lipase: 25 U/L (ref 11–51)

## 2019-07-10 MED ORDER — DICYCLOMINE HCL 10 MG PO CAPS: 10.0000 mg | ORAL_CAPSULE | Freq: Four times a day (QID) | ORAL | 0 refills | Status: DC

## 2019-07-10 MED ORDER — SODIUM CHLORIDE 0.9% FLUSH: 3.0000 mL | Freq: Once | INTRAVENOUS | Status: DC

## 2019-07-10 NOTE — Progress Notes (Signed)
Patient ID: Isaac Sims, male   DOB: 1976-04-19, 44 y.o.   MRN: [id number]   Virtual Visit via video Note  This visit type was conducted due to national recommendations for restrictions regarding the COVID-19 pandemic (e.g. social distancing).  This format is felt to be most appropriate for this patient at this time.  All issues noted in this document were discussed and addressed.  No physical exam was performed (except for noted visual exam findings with Video Visits).   I connected with Isaac Sims by a video enabled telemedicine application and verified that I am speaking with the correct person using two identifiers. Location patient: home Location provider: work  Persons participating in the virtual visit: patient, provider  The limitations, risks, security and privacy concerns of performing an evaluation and management service by video and the availability of in person appointments have been discussed.  The patient expressed understanding and agreed to proceed.   Reason for visit: work in appt  HPI: Reports he has had some stomach pain for a while now.  Over the last two weeks, it appears has worsened.  Increased stomach pain - two weeks ago.  Last week, described 5-6 x/day - loose stool and coffee ground stool.  Pain worsens at times - stabbing pain - left side and mid abdomen.  Will have loose bm and this week "notices a lot of blood" with bm - with wiping.  No fever.  Some nausea.  No actual vomiting.  Does describe joint pain and back pain.  Lower abdomen and left side pain - worsens with eating and before a bm.  Will also feel increased pain down into his testicles.  Stools as outlined have been black and previous coffee ground. Now look like bile/battery acid - until wipes and then increased BRB.  Tool pepto bismol one day last week.  Had black stools multiple days.  Not taking protonix.  No acid reflux reported.  States eating and increased physical activity seems to set off  the symptoms/pain.  Rates pain 6/10.  No fever.  No cough or congestion.  Does report noticing sob with exertion.    ROS: See pertinent positives and negatives per HPI.  Past Medical History:  Diagnosis Date  . Duodenitis   . Esophagitis   . GERD (gastroesophageal reflux disease)   . Hiatal hernia   . Hypercholesterolemia   . Nephrolithiasis     Past Surgical History:  Procedure Laterality Date  . LITHOTRIPSY  2011    Family History  Problem Relation Age of Onset  . Alzheimer's disease Maternal Grandfather   . Diabetes Other        parent  . Colon cancer Neg Hx     SOCIAL HX: reviewed.    Current Outpatient Medications:  .  pantoprazole (PROTONIX) 20 MG tablet, Take 1 tablet (20 mg total) by mouth daily., Disp: 30 tablet, Rfl: 1 No current facility-administered medications for this visit.  Facility-Administered Medications Ordered in Other Visits:  .  sodium chloride flush (NS) 0.9 % injection 3 mL, 3 mL, Intravenous, Once, Earleen Newport, MD  EXAM:  GENERAL: alert, oriented, appears in no acute distress  HEENT: atraumatic, conjunttiva clear, no obvious abnormalities on inspection of external nose and ears  NECK: normal movements of the head and neck  LUNGS: on inspection no signs of respiratory distress, breathing rate appears normal, no obvious gross SOB, gasping or wheezing  CV: no obvious cyanosis  PSYCH/NEURO: pleasant and cooperative, no obvious  depression or anxiety, speech and thought processing grossly intact  ASSESSMENT AND PLAN:  Discussed the following assessment and plan:  Abdominal pain Pain localized to LLQ and mid abdomen as outlined.  Stabbing pain at times.  Pain into testicles at times.  Feels better after bm.  Increased and persistent rectal bleeding. Previous black stool.  Not taking protonix.  Has history of GERD.  Some nausea.  Given persistent increased pain and black stools with now BRBPR, feel ER evaluation warranted.  Discussed  need for labs and possible CT scan to better determine etiology of pain and bleeding.  Pt in agreement.  ER notified.    GERD (gastroesophageal reflux disease) Not taking protonix now.  Will need to restart.   SOB (shortness of breath) on exertion Notices with ambulation.  Some fatigue.  With black stool initially and now BRBPR with every bowel movement, discussed need to check cbc - question of anemia.  No chest pain.    Rectal bleeding Black stools last week.  Now with BRBPR with every bowel movement.  Bleeding associated with increased abdominal pain.  Persistent.  Discussed possible etiologies.  With nausea and some decreased appetite.  Feel ER evaluation warranted.  Discussed labs and CT scan.  Question colitis, etc.  Pt in agreement for ER evaluation.  ER notified.     I discussed the assessment and treatment plan with the patient. The patient was provided an opportunity to ask questions and all were answered. The patient agreed with the plan and demonstrated an understanding of the instructions.   The patient was advised to call back or seek an in-person evaluation if the symptoms worsen or if the condition fails to improve as anticipated.   Dale Woodbridge, MD

## 2019-07-10 NOTE — ED Triage Notes (Signed)
Pt comes via POV from home with c/o lower left abdominal pain. Pt states this started about 2 weeks ago.  Pt states he is having bright red blood in his stool. Pt states N/V/D. Pt also states weakness. Pt denies any dizziness and CP. Pt states some SOB,

## 2019-07-10 NOTE — Assessment & Plan Note (Signed)
Not taking protonix now.  Will need to restart.

## 2019-07-10 NOTE — Assessment & Plan Note (Signed)
Pain localized to LLQ and mid abdomen as outlined.  Stabbing pain at times.  Pain into testicles at times.  Feels better after bm.  Increased and persistent rectal bleeding. Previous black stool.  Not taking protonix.  Has history of GERD.  Some nausea.  Given persistent increased pain and black stools with now BRBPR, feel ER evaluation warranted.  Discussed need for labs and possible CT scan to better determine etiology of pain and bleeding.  Pt in agreement.  ER notified.

## 2019-07-10 NOTE — Assessment & Plan Note (Signed)
Notices with ambulation.  Some fatigue.  With black stool initially and now BRBPR with every bowel movement, discussed need to check cbc - question of anemia.  No chest pain.

## 2019-07-10 NOTE — Assessment & Plan Note (Signed)
Black stools last week.  Now with BRBPR with every bowel movement.  Bleeding associated with increased abdominal pain.  Persistent.  Discussed possible etiologies.  With nausea and some decreased appetite.  Feel ER evaluation warranted.  Discussed labs and CT scan.  Question colitis, etc.  Pt in agreement for ER evaluation.  ER notified.

## 2019-07-10 NOTE — ED Provider Notes (Addendum)
[facility] Emergency Department Provider Note   ____________________________________________    I have reviewed the triage vital signs and the nursing notes.   HISTORY  Chief Complaint Abdominal Pain     HPI Isaac Sims is a 44 y.o. male who presents with complaints of abdominal pain.  Patient describes lower abdominal pain, primarily in the left lower quadrant which is been ongoing for over a week.  He reports it is somewhat intermittent and seems to be worse with exertion.  Has also had frequent episodes of diarrhea, denies nausea or vomiting.  No fevers or chills.  Has not take anything for this.  No dysuria or frequency or hematuria  Past Medical History:  Diagnosis Date  . Duodenitis   . Esophagitis   . GERD (gastroesophageal reflux disease)   . Hiatal hernia   . Hypercholesterolemia   . Nephrolithiasis     Patient Active Problem List   Diagnosis Date Noted  . Abdominal pain 07/10/2019  . Rectal bleeding 07/10/2019  . Healthcare maintenance 03/18/2017  . Abnormal CXR 04/14/2015  . Left shoulder pain 02/18/2015  . Abnormal liver function test 02/18/2015  . Chest pain 02/13/2015  . SOB (shortness of breath) on exertion 02/13/2015  . Tobacco use 04/17/2014  . Cough 04/17/2014  . Hyperglycemia 04/17/2014  . Obstructive sleep apnea 10/16/2012  . Hypercholesterolemia 10/16/2012  . Nephrolithiasis 05/15/2012  . GERD (gastroesophageal reflux disease) 05/15/2012    Past Surgical History:  Procedure Laterality Date  . LITHOTRIPSY  2011    Prior to Admission medications   Medication Sig Start Date End Date Taking? Authorizing Provider  dicyclomine (BENTYL) 10 MG capsule Take 1 capsule (10 mg total) by mouth 4 (four) times daily for 14 days. 07/10/19 07/24/19  Jene Every, MD  pantoprazole (PROTONIX) 20 MG tablet Take 1 tablet (20 mg total) by mouth daily. 05/10/17   Roddie Mc, FNP     Allergies Aspirin, Ibuprofen,  and Tylenol [acetaminophen]  Family History  Problem Relation Age of Onset  . Alzheimer's disease Maternal Grandfather   . Diabetes Other        parent  . Colon cancer Neg Hx     Social History Social History   Tobacco Use  . Smoking status: Current Every Day Smoker    Last attempt to quit: 07/16/2008    Years since quitting: 10.9  . Smokeless tobacco: Current User    Types: Snuff  . Tobacco comment: stopped in 04/15/14  Substance Use Topics  . Alcohol use: Yes    Alcohol/week: 0.0 standard drinks    Comment: occasional  . Drug use: No    Review of Systems  Constitutional: No fever/chills Eyes: No visual changes.  ENT: No sore throat. Cardiovascular: Denies chest pain. Respiratory: Denies shortness of breath. Gastrointestinal: Abdominal pain as above Genitourinary: As above Musculoskeletal: Negative for back pain. Skin: Negative for rash. Neurological: Negative for headaches    ____________________________________________   PHYSICAL EXAM:  VITAL SIGNS: ED Triage Vitals  Enc Vitals Group     BP 07/10/19 1633 127/76     Pulse Rate 07/10/19 1633 79     Resp 07/10/19 1633 18     Temp 07/10/19 1633 98.1 F (36.7 C)     Temp src --      SpO2 07/10/19 1633 100 %     Weight 07/10/19 1634 120.2 kg (265 lb)     Height 07/10/19 1634 1.803 m (5\' 11" )  Head Circumference --      Peak Flow --      Pain Score 07/10/19 1634 8     Pain Loc --      Pain Edu? --      Excl. in [place]? --     Constitutional: Alert and oriented.   Nose: No congestion/rhinnorhea. Mouth/Throat: Mucous membranes are moist.    Cardiovascular: Normal rate, regular rhythm. Grossly normal heart sounds.  Good peripheral circulation. Respiratory: Normal respiratory effort.  No retractions. Lungs CTAB. Gastrointestinal: Mild tenderness in the left lower quadrant. No distention.  No CVA tenderness. GU: No palpable hernia Musculoskeletal:  Warm and well perfused Neurologic:  Normal speech and  language. No gross focal neurologic deficits are appreciated.  Skin:  Skin is warm, dry and intact. No rash noted. Psychiatric: Mood and affect are normal. Speech and behavior are normal.  ____________________________________________   LABS (all labs ordered are listed, but only abnormal results are displayed)  Labs Reviewed  COMPREHENSIVE METABOLIC PANEL - Abnormal; Notable for the following components:      Result Value   Glucose, Bld 106 (*)    All other components within normal limits  LIPASE, BLOOD  CBC  URINALYSIS, COMPLETE (UACMP) WITH MICROSCOPIC   ____________________________________________  EKG  ED ECG REPORT I, Lavonia Drafts, the attending physician, personally viewed and interpreted this ECG.  Date: 07/24/2019  Rhythm: normal sinus rhythm QRS Axis: normal Intervals: normal ST/T Wave abnormalities: normal Narrative Interpretation: no evidence of acute ischemia  ____________________________________________  RADIOLOGY  CT renal stone study reassuring, inguinal hernia fat-containing ____________________________________________   PROCEDURES  Procedure(s) performed: No  Procedures   Critical Care performed: No ____________________________________________   INITIAL IMPRESSION / ASSESSMENT AND PLAN / ED COURSE  Pertinent labs & imaging results that were available during my care of the patient were reviewed by me and considered in my medical decision making (see chart for details).  Patient well-appearing and in no acute distress, lab work is reassuring, exam demonstrates only some mild tenderness in the left lower quadrant, suspicious for possible diverticulitis versus ureterolithiasis, will send for scan.  CT overall quite reassuring, inguinal hernia, fat-containing discussed with him the need for outpatient follow-up this if this becomes a problem for him.  I will have him follow-up with GI for further evaluation as needed.  Will trial Bentyl      ____________________________________________   FINAL CLINICAL IMPRESSION(S) / ED DIAGNOSES  Final diagnoses:  Lower abdominal pain  Unilateral inguinal hernia without obstruction or gangrene, recurrence not specified        Note:  This document was prepared using Dragon voice recognition software and may include unintentional dictation errors.   Lavonia Drafts, MD 07/10/19 Barnett Abu    Lavonia Drafts, MD 07/24/19 [phone]

## 2019-07-11 LAB — URINE CULTURE: Culture: NO GROWTH

## 2020-06-28 ENCOUNTER — Ambulatory Visit: Payer: BC Managed Care – PPO

## 2020-06-28 ENCOUNTER — Telehealth: Payer: Self-pay | Admitting: Internal Medicine

## 2020-06-28 NOTE — Telephone Encounter (Signed)
Pt scheduled with resp clinic 

## 2020-06-28 NOTE — Telephone Encounter (Signed)
Patient's wife returned Trisha's phone call.

## 2021-12-05 ENCOUNTER — Telehealth: Payer: Self-pay

## 2021-12-05 NOTE — Telephone Encounter (Signed)
Spoke to patient and was able to schedule him an appointment for 03/09/22 to re establish care

## 2022-03-09 ENCOUNTER — Ambulatory Visit: Payer: BC Managed Care – PPO | Admitting: Internal Medicine

## 2022-03-09 NOTE — Progress Notes (Deleted)
Patient ID: Isaac Sims, male   DOB: [birth year], 46 y.o.   MRN: [id number]   Subjective:    Patient ID: Isaac Sims, male    DOB: 1975/07/18, 46 y.o.   MRN: [id number]   Patient here for No chief complaint on file.  Marland Kitchen   HPI    Past Medical History:  Diagnosis Date   Duodenitis    Esophagitis    GERD (gastroesophageal reflux disease)    Hiatal hernia    Hypercholesterolemia    Nephrolithiasis    Past Surgical History:  Procedure Laterality Date   LITHOTRIPSY  2011   Family History  Problem Relation Age of Onset   Alzheimer's disease Maternal Grandfather    Diabetes Other        parent   Colon cancer Neg Hx    Social History   Socioeconomic History   Marital status: Married    Spouse name: Not on file   Number of children: 3   Years of education: Not on file   Highest education level: Not on file  Occupational History   Not on file  Tobacco Use   Smoking status: Every Day   Smokeless tobacco: Current    Types: Snuff   Tobacco comments:    stopped in 04/15/14  Substance and Sexual Activity   Alcohol use: Yes    Alcohol/week: 0.0 standard drinks of alcohol    Comment: occasional   Drug use: No   Sexual activity: Not on file  Other Topics Concern   Not on file  Social History Narrative   Not on file   Social Determinants of Health   Financial Resource Strain: Not on file  Food Insecurity: Not on file  Transportation Needs: Not on file  Physical Activity: Not on file  Stress: Not on file  Social Connections: Not on file     Review of Systems     Objective:     There were no vitals taken for this visit. Wt Readings from Last 3 Encounters:  07/10/19 265 lb (120.2 kg)  07/10/19 265 lb (120.2 kg)  05/10/17 227 lb 3.2 oz (103.1 kg)    Physical Exam   Outpatient Encounter Medications as of 03/09/2022  Medication Sig   dicyclomine (BENTYL) 10 MG capsule Take 1 capsule (10 mg total) by mouth 4 (four) times daily for 14 days.    pantoprazole (PROTONIX) 20 MG tablet Take 1 tablet (20 mg total) by mouth daily.   No facility-administered encounter medications on file as of 03/09/2022.     Lab Results  Component Value Date   WBC 7.8 07/10/2019   HGB 13.5 07/10/2019   HCT 39.8 07/10/2019   PLT 301 07/10/2019   GLUCOSE 106 (H) 07/10/2019   CHOL 262 (H) 03/11/2017   TRIG 116.0 03/11/2017   HDL 33.40 (L) 03/11/2017   LDLDIRECT 149.0 02/08/2015   LDLCALC 206 (H) 03/11/2017   ALT 28 07/10/2019   AST 25 07/10/2019   NA 140 07/10/2019   K 4.2 07/10/2019   CL 106 07/10/2019   CREATININE 1.14 07/10/2019   BUN 13 07/10/2019   CO2 28 07/10/2019   TSH 0.71 03/11/2017   PSA 0.73 03/11/2017   INR 0.9 09/04/2008   HGBA1C 5.9 03/11/2017    CT RENAL STONE STUDY  Result Date: 07/10/2019 CLINICAL DATA:  46 year old male with LEFT abdominal and pelvic pain for 2 weeks. EXAM: CT ABDOMEN AND PELVIS WITHOUT CONTRAST TECHNIQUE: Multidetector CT imaging of the abdomen and  pelvis was performed following the standard protocol without IV contrast. COMPARISON:  07/08/2009 CT FINDINGS: Please note that parenchymal abnormalities may be missed without intravenous contrast. Lower chest: No acute abnormality. Hepatobiliary: The liver and gallbladder are unremarkable. No biliary dilatation. Pancreas: Unremarkable Spleen: Unremarkable Adrenals/Urinary Tract: The kidneys, adrenal glands and bladder are unremarkable. Stomach/Bowel: Stomach is within normal limits. Appendix appears normal. No evidence of bowel wall thickening, distention, or inflammatory changes. Mild colonic diverticulosis noted without evidence of acute diverticulitis. Vascular/Lymphatic: Aortic atherosclerosis. No enlarged abdominal or pelvic lymph nodes. Reproductive: Prostate is unremarkable. Other: A LEFT inguinal hernia containing fat is unchanged. No ascites, focal collection or pneumoperitoneum. Musculoskeletal: No acute or suspicious bony abnormalities. IMPRESSION: 1. No  evidence of acute abnormality. 2. Mild colonic diverticulosis without evidence of acute diverticulitis. 3. Unchanged LEFT inguinal hernia containing fat. 4.  Aortic Atherosclerosis (ICD10-I70.0). Electronically Signed   By: Margarette Canada M.D.   On: 07/10/2019 18:37       Assessment & Plan:   Problem List Items Addressed This Visit   None    Einar Pheasant, MD

## 2022-03-09 NOTE — Assessment & Plan Note (Deleted)
The 10-year ASCVD risk score (Arnett DK, et al., 2019) is: 11.2%   Values used to calculate the score:     Age: 46 years     Sex: Male     Is Non-Hispanic African American: No     Diabetic: No     Tobacco smoker: Yes     Systolic Blood Pressure: 004 mmHg     Is BP treated: No     HDL Cholesterol: 35 MG/DL     Total Cholesterol: 220 MG/DL

## 2022-06-22 ENCOUNTER — Ambulatory Visit: Payer: BC Managed Care – PPO | Admitting: Internal Medicine

## 2022-06-22 ENCOUNTER — Encounter: Payer: Self-pay | Admitting: Internal Medicine

## 2022-06-22 VITALS — BP 132/80 | HR 72 | Temp 98.2°F | Resp 15 | Ht 71.0 in | Wt 290.6 lb

## 2022-06-22 DIAGNOSIS — E7800 Pure hypercholesterolemia, unspecified: Secondary | ICD-10-CM

## 2022-06-22 DIAGNOSIS — Z1211 Encounter for screening for malignant neoplasm of colon: Secondary | ICD-10-CM

## 2022-06-22 DIAGNOSIS — Z136 Encounter for screening for cardiovascular disorders: Secondary | ICD-10-CM

## 2022-06-22 DIAGNOSIS — R739 Hyperglycemia, unspecified: Secondary | ICD-10-CM | POA: Diagnosis not present

## 2022-06-22 DIAGNOSIS — R7989 Other specified abnormal findings of blood chemistry: Secondary | ICD-10-CM | POA: Diagnosis not present

## 2022-06-22 DIAGNOSIS — N200 Calculus of kidney: Secondary | ICD-10-CM | POA: Diagnosis not present

## 2022-06-22 DIAGNOSIS — K219 Gastro-esophageal reflux disease without esophagitis: Secondary | ICD-10-CM | POA: Diagnosis not present

## 2022-06-22 NOTE — Progress Notes (Signed)
Subjective:    Patient ID: Isaac Sims, male    DOB: 1975/12/16, 47 y.o.   MRN: [id number]  Patient here for  Chief Complaint  Patient presents with   Establish Care    HPI Here to reestablish care.  Last seen 07/10/19.  At that time was having increased abdominal pain, rectal bleeding, nausea and black stools.  CT scan then -  No evidence of acute abnormality. Mild colonic diverticulosis without evidence of acute. Diverticulitis. Unchanged LEFT inguinal hernia containing fat. Aortic Atherosclerosis (ICD10-I70.0). reports no rectal bleeding now.  Occasional heart burn.  Takes TUMS.  Relieves.  Does not feel needs anything more.  Is not as active. Tries to stay physically active.  Not exercising regularly.  No chest pain or sob reported.  No increased cough or congestion.  No abdominal pain reported.  Seeing urology.  On testosterone replacement.  Feels better.  They are monitoring psa.     Past Medical History:  Diagnosis Date   Duodenitis    Esophagitis    GERD (gastroesophageal reflux disease)    Hiatal hernia    Hypercholesterolemia    Nephrolithiasis    Past Surgical History:  Procedure Laterality Date   LITHOTRIPSY  2011   Family History  Problem Relation Age of Onset   Alzheimer's disease Maternal Grandfather    Diabetes Other        parent   Colon cancer Neg Hx    Social History   Socioeconomic History   Marital status: Married    Spouse name: Not on file   Number of children: 3   Years of education: Not on file   Highest education level: Not on file  Occupational History   Not on file  Tobacco Use   Smoking status: Every Day   Smokeless tobacco: Current    Types: Snuff   Tobacco comments:    stopped in 04/15/14  Substance and Sexual Activity   Alcohol use: Yes    Alcohol/week: 0.0 standard drinks of alcohol    Comment: occasional   Drug use: No   Sexual activity: Not on file  Other Topics Concern   Not on file  Social History Narrative   Not  on file   Social Determinants of Health   Financial Resource Strain: Not on file  Food Insecurity: Not on file  Transportation Needs: Not on file  Physical Activity: Not on file  Stress: Not on file  Social Connections: Not on file     Review of Systems  Constitutional:  Negative for appetite change and unexpected weight change.  HENT:  Negative for congestion and sinus pressure.   Respiratory:  Negative for cough, chest tightness and shortness of breath.   Cardiovascular:  Negative for chest pain, palpitations and leg swelling.  Gastrointestinal:  Negative for abdominal pain, diarrhea, nausea and vomiting.  Genitourinary:  Negative for difficulty urinating and dysuria.  Musculoskeletal:  Negative for joint swelling and myalgias.  Skin:  Negative for color change and rash.  Neurological:  Negative for dizziness and headaches.  Psychiatric/Behavioral:  Negative for agitation and dysphoric mood.        Objective:     BP 132/80 (BP Location: Left Arm, Patient Position: Sitting, Cuff Size: Large)   Pulse 72   Temp 98.2 F (36.8 C) (Temporal)   Resp 15   Ht 5\' 11"  (1.803 m)   Wt 290 lb 9.6 oz (131.8 kg)   SpO2 97%   BMI 40.53 kg/m  Wt Readings from Last 3 Encounters:  06/22/22 290 lb 9.6 oz (131.8 kg)  07/10/19 265 lb (120.2 kg)  07/10/19 265 lb (120.2 kg)    Physical Exam Constitutional:      General: He is not in acute distress.    Appearance: Normal appearance. He is well-developed.  HENT:     Head: Normocephalic and atraumatic.     Right Ear: External ear normal.     Left Ear: External ear normal.  Eyes:     General: No scleral icterus.       Right eye: No discharge.        Left eye: No discharge.  Cardiovascular:     Rate and Rhythm: Normal rate and regular rhythm.  Pulmonary:     Effort: Pulmonary effort is normal. No respiratory distress.     Breath sounds: Normal breath sounds.  Abdominal:     General: Bowel sounds are normal.     Palpations:  Abdomen is soft.     Tenderness: There is no abdominal tenderness.  Musculoskeletal:        General: No swelling or tenderness.     Cervical back: Neck supple. No tenderness.  Lymphadenopathy:     Cervical: No cervical adenopathy.  Skin:    Findings: No erythema or rash.  Neurological:     Mental Status: He is alert.  Psychiatric:        Mood and Affect: Mood normal.        Behavior: Behavior normal.      Outpatient Encounter Medications as of 06/22/2022  Medication Sig   testosterone cypionate (DEPOTESTOSTERONE CYPIONATE) 200 MG/ML injection Inject 100 mg into the muscle once a week.   [DISCONTINUED] dicyclomine (BENTYL) 10 MG capsule Take 1 capsule (10 mg total) by mouth 4 (four) times daily for 14 days.   [DISCONTINUED] pantoprazole (PROTONIX) 20 MG tablet Take 1 tablet (20 mg total) by mouth daily.   No facility-administered encounter medications on file as of 06/22/2022.     Lab Results  Component Value Date   WBC 7.8 07/10/2019   HGB 13.5 07/10/2019   HCT 39.8 07/10/2019   PLT 301 07/10/2019   GLUCOSE 106 (H) 07/10/2019   CHOL 262 (H) 03/11/2017   TRIG 116.0 03/11/2017   HDL 33.40 (L) 03/11/2017   LDLDIRECT 149.0 02/08/2015   LDLCALC 206 (H) 03/11/2017   ALT 28 07/10/2019   AST 25 07/10/2019   NA 140 07/10/2019   K 4.2 07/10/2019   CL 106 07/10/2019   CREATININE 1.14 07/10/2019   BUN 13 07/10/2019   CO2 28 07/10/2019   TSH 0.71 03/11/2017   PSA 0.73 03/11/2017   INR 0.9 09/04/2008   HGBA1C 5.9 03/11/2017    CT RENAL STONE STUDY  Result Date: 07/10/2019 CLINICAL DATA:  47 year old male with LEFT abdominal and pelvic pain for 2 weeks. EXAM: CT ABDOMEN AND PELVIS WITHOUT CONTRAST TECHNIQUE: Multidetector CT imaging of the abdomen and pelvis was performed following the standard protocol without IV contrast. COMPARISON:  07/08/2009 CT FINDINGS: Please note that parenchymal abnormalities may be missed without intravenous contrast. Lower chest: No acute abnormality.  Hepatobiliary: The liver and gallbladder are unremarkable. No biliary dilatation. Pancreas: Unremarkable Spleen: Unremarkable Adrenals/Urinary Tract: The kidneys, adrenal glands and bladder are unremarkable. Stomach/Bowel: Stomach is within normal limits. Appendix appears normal. No evidence of bowel wall thickening, distention, or inflammatory changes. Mild colonic diverticulosis noted without evidence of acute diverticulitis. Vascular/Lymphatic: Aortic atherosclerosis. No enlarged abdominal or pelvic lymph nodes. Reproductive:  Prostate is unremarkable. Other: A LEFT inguinal hernia containing fat is unchanged. No ascites, focal collection or pneumoperitoneum. Musculoskeletal: No acute or suspicious bony abnormalities. IMPRESSION: 1. No evidence of acute abnormality. 2. Mild colonic diverticulosis without evidence of acute diverticulitis. 3. Unchanged LEFT inguinal hernia containing fat. 4.  Aortic Atherosclerosis (ICD10-I70.0). Electronically Signed   By: Margarette Canada M.D.   On: 07/10/2019 18:37       Assessment & Plan:  Hypercholesterolemia Assessment & Plan:  Low cholesterol diet and exercise.  Follow lipid panel.    Orders: -     Basic metabolic panel; Future -     Lipid panel; Future -     Hepatic function panel; Future -     TSH; Future  Hyperglycemia Assessment & Plan: Low carb diet and exercise.  Follow met b and a1c.    Orders: -     Hemoglobin A1c; Future  Abnormal liver function test Assessment & Plan: Discussed diet and exercise.  Follow liver panel.    Gastroesophageal reflux disease, unspecified whether esophagitis present Assessment & Plan: Takes TUMS. Does not feel needs anything more.  Discussed avoiding foods that trigger.  Avoid eating late. Follow.    Nephrolithiasis Assessment & Plan: Previously worked up by Dr Yves Dill.  S/P lithotripsy.  Currently asymptomatic.  Being followed by urology.    Colon cancer screening Assessment & Plan: Previus abdominal pain  and rectal bleeding - not an issue now.  Discussed need for colonoscopy - colon cancer screening.  Will notify me when agreeable.    Low testosterone in male Assessment & Plan: Being followed by urology.  On testosterone replacement.  They are following psa and cbc.     Encounter for screening for coronary artery disease Assessment & Plan: Discussed CT calcium score given risk factors.  Agreeable.    Orders: -     CT CARDIAC SCORING (SELF PAY ONLY); Future     Einar Pheasant, MD

## 2022-06-28 DIAGNOSIS — R7989 Other specified abnormal findings of blood chemistry: Secondary | ICD-10-CM | POA: Insufficient documentation

## 2022-06-28 DIAGNOSIS — Z1211 Encounter for screening for malignant neoplasm of colon: Secondary | ICD-10-CM | POA: Insufficient documentation

## 2022-06-28 DIAGNOSIS — Z136 Encounter for screening for cardiovascular disorders: Secondary | ICD-10-CM | POA: Insufficient documentation

## 2022-06-28 NOTE — Assessment & Plan Note (Signed)
Being followed by urology.  On testosterone replacement.  They are following psa and cbc.

## 2022-06-28 NOTE — Assessment & Plan Note (Signed)
Previously worked up by Dr Yves Dill.  S/P lithotripsy.  Currently asymptomatic.  Being followed by urology.

## 2022-06-28 NOTE — Assessment & Plan Note (Signed)
Low carb diet and exercise.  Follow met b and a1c.   

## 2022-06-28 NOTE — Assessment & Plan Note (Signed)
Previus abdominal pain and rectal bleeding - not an issue now.  Discussed need for colonoscopy - colon cancer screening.  Will notify me when agreeable.

## 2022-06-28 NOTE — Assessment & Plan Note (Signed)
Low cholesterol diet and exercise.  Follow lipid panel.   

## 2022-06-28 NOTE — Assessment & Plan Note (Signed)
Discussed diet and exercise.  Follow liver panel.   

## 2022-06-28 NOTE — Assessment & Plan Note (Signed)
Takes TUMS. Does not feel needs anything more.  Discussed avoiding foods that trigger.  Avoid eating late. Follow.

## 2022-06-28 NOTE — Assessment & Plan Note (Signed)
Discussed CT calcium score given risk factors.  Agreeable.

## 2022-07-07 ENCOUNTER — Ambulatory Visit
Admission: RE | Admit: 2022-07-07 | Discharge: 2022-07-07 | Disposition: A | Discharge status: Home / Self Care | Payer: BC Managed Care – PPO | Source: Ambulatory Visit | Attending: Internal Medicine | Admitting: Internal Medicine

## 2022-07-07 DIAGNOSIS — Z136 Encounter for screening for cardiovascular disorders: Secondary | ICD-10-CM | POA: Insufficient documentation

## 2022-09-18 ENCOUNTER — Other Ambulatory Visit: Payer: BC Managed Care – PPO

## 2022-09-22 ENCOUNTER — Encounter: Payer: BC Managed Care – PPO | Admitting: Internal Medicine
